# Patient Record
Sex: Male | Born: 1990
Health system: Southern US, Community
[De-identification: ages and names within clinical notes are randomized; demographics above are authoritative.]

## PROBLEM LIST (undated history)

## (undated) DIAGNOSIS — F329 Major depressive disorder, single episode, unspecified: Secondary | ICD-10-CM

## (undated) DIAGNOSIS — J45909 Unspecified asthma, uncomplicated: Secondary | ICD-10-CM

## (undated) DIAGNOSIS — F32A Depression, unspecified: Secondary | ICD-10-CM

## (undated) DIAGNOSIS — F419 Anxiety disorder, unspecified: Secondary | ICD-10-CM

## (undated) DIAGNOSIS — T7840XA Allergy, unspecified, initial encounter: Secondary | ICD-10-CM

## (undated) HISTORY — PX: FOOT SURGERY: SHX648

## (undated) HISTORY — DX: Allergy, unspecified, initial encounter: T78.40XA

## (undated) HISTORY — DX: Unspecified asthma, uncomplicated: J45.909

## (undated) HISTORY — DX: Anxiety disorder, unspecified: F41.9

## (undated) HISTORY — DX: Major depressive disorder, single episode, unspecified: F32.9

## (undated) HISTORY — DX: Depression, unspecified: F32.A

---

## 2011-10-05 DIAGNOSIS — S97109A Crushing injury of unspecified toe(s), initial encounter: Secondary | ICD-10-CM | POA: Insufficient documentation

## 2013-05-04 DIAGNOSIS — K219 Gastro-esophageal reflux disease without esophagitis: Secondary | ICD-10-CM | POA: Insufficient documentation

## 2014-06-11 ENCOUNTER — Ambulatory Visit (INDEPENDENT_AMBULATORY_CARE_PROVIDER_SITE_OTHER): Payer: BLUE CROSS/BLUE SHIELD

## 2014-06-11 ENCOUNTER — Ambulatory Visit (INDEPENDENT_AMBULATORY_CARE_PROVIDER_SITE_OTHER): Payer: BLUE CROSS/BLUE SHIELD | Admitting: Internal Medicine

## 2014-06-11 VITALS — BP 122/82 | HR 85 | Temp 98.0°F | Resp 16 | Ht 72.0 in | Wt 176.4 lb

## 2014-06-11 DIAGNOSIS — R1032 Left lower quadrant pain: Secondary | ICD-10-CM | POA: Diagnosis not present

## 2014-06-11 DIAGNOSIS — R1031 Right lower quadrant pain: Secondary | ICD-10-CM

## 2014-06-11 DIAGNOSIS — M25551 Pain in right hip: Secondary | ICD-10-CM

## 2014-06-11 DIAGNOSIS — M25552 Pain in left hip: Secondary | ICD-10-CM

## 2014-06-11 NOTE — Progress Notes (Signed)
   Subjective:    Patient ID: George Duran, male    DOB: 07/17/1990, 24 y.o.   MRN: [id number]  HPI I, Trenda MootsMichelle Farrington R.T. (R), am scribing for Dr. Robert Bellowhris Velva Molinari.  George Duran is a 24 y.o. Male presenting today with bilateral hip pain. 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 He states he iced it 4 - 6 times that day and it felt worse over night. He has a low level of pain, according to him it just uncomfortable. 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 He has no change in his pain level during normal daily activities. He says now that he has the same pain in his left hip 5 days ago.  He has full rom and full function both extremities. He is worried he may have a congenital hip problem like his foot problems.   Review of Systems     Objective:   Physical Exam  Constitutional: He is oriented to person, place, and time. He appears well-developed and well-nourished. No distress.  Eyes: EOM are normal.  Neck: Normal range of motion.  Pulmonary/Chest: Effort normal.  Abdominal: Hernia confirmed negative in the right inguinal area and confirmed negative in the left inguinal area.  Genitourinary: Testes normal.  Musculoskeletal:       Right hip: He exhibits tenderness. He exhibits normal range of motion, normal strength, no bony tenderness, no swelling, no crepitus, no deformity and no laceration.       Left hip: He exhibits tenderness. He exhibits normal range of motion, normal strength, no bony tenderness, no swelling, no crepitus, no deformity and no laceration.       Legs: Dull constant ache in groin.  Lymphadenopathy:       Right: No inguinal adenopathy present.       Left: No inguinal adenopathy present.  Neurological: He is alert and oriented to person, place, and time. He displays normal reflexes. No cranial nerve deficit. He exhibits normal muscle tone. Coordination normal.  Psychiatric: He has a normal mood and  affect. His behavior is normal. Judgment and thought content normal.     [facility] reading (PRIMARY) by  Dr Perrin MalteseGuest possible left posterior acetabulum avulsion, stat read please.       Assessment & Plan:  Groin strains RICE Adjust work

## 2014-06-11 NOTE — Patient Instructions (Signed)
Groin Strain °A groin strain (also called a groin pull) is an injury to the muscles or tendon on the upper inner part of the thigh. These muscles are called the adductor muscles or groin muscles. They are responsible for moving the leg across the body. A muscle strain occurs when a muscle is overstretched and some muscle fibers are torn. A groin strain can range from mild to severe depending on how many muscle fibers are affected and whether the muscle fibers are partially or completely torn.  °Groin strains usually occur during exercise or participation in sports. The injury often happens when a sudden, violent force is placed on a muscle, stretching the muscle too far. A strain is more likely to occur when your muscles are not warmed up or if you are not properly conditioned. Depending on the severity of the groin strain, recovery time may vary from a few weeks to several weeks. Severe injuries often require 4-6 weeks for recovery. In these cases, complete healing can take 4-5 months.  °CAUSES  °· Stretching the groin muscles too far or too suddenly, often during side-to-side motion with an abrupt change in direction. °· Putting repeated stress on the groin muscles over a long period of time. °· Performing vigorous activity without properly stretching the groin muscles beforehand. °SYMPTOMS  °· Pain and tenderness in the groin area. This begins as sharp pain and persists as a dull ache. °· Popping or snapping feeling when the injury occurs (for severe strains). °· Swelling or bruising. °· Muscle spasms. °· Weakness in the leg. °· Stiffness in the groin area with decreased ability to move the affected muscles. °DIAGNOSIS  °Your caregiver will perform a physical exam to diagnose a groin strain. You will be asked about your symptoms and how the injury occurred. X-rays are sometimes needed to rule out a broken bone or cartilage problems. Your caregiver may order a CT scan or MRI if a complete muscle tear is  suspected. °TREATMENT  °A groin strain will often heal on its own. Your caregiver may prescribe medicines to help manage pain and swelling (anti-inflammatory medicine). You may be told to use crutches for the first few days to minimize your pain. °HOME CARE INSTRUCTIONS  °· Rest. Do not use the strained muscle if it causes pain. °· Put ice on the injured area. °¨ Put ice in a plastic bag. °¨ Place a towel between your skin and the bag. °¨ Leave the ice on for 15-20 minutes, every 2-3 hours. Do this for the first 2 days after the injury.  °· Only take over-the-counter or prescription medicines as directed by your caregiver. °· Wrap the injured area with an elastic bandage as directed by your caregiver. °· Keep the injured leg raised (elevated). °· Walk, stretch, and perform range-of-motion exercises to improve blood flow to the injured area. Only perform these activities if you can do so without any pain. °To prevent muscle strains: °· Warm up before exercise. °· Develop proper conditioning and strength in the groin muscles. °SEEK IMMEDIATE MEDICAL CARE IF:  °· You have increased pain or swelling in the affected area.   °· Your symptoms are not improving or are getting worse. °MAKE SURE YOU:  °· Understand these instructions. °· Will watch your condition. °· Will get help right away if you are not doing well or get worse. °Document Released: 10/28/2003 Document Revised: 02/16/2012 Document Reviewed: 11/03/2011 °ExitCare® Patient Information ©2015 [employer]. This information is not intended to replace advice given to you   by your health care provider. Make sure you discuss any questions you have with your health care provider. ° °

## 2014-09-22 ENCOUNTER — Emergency Department (HOSPITAL_COMMUNITY)
Admission: EM | Admit: 2014-09-22 | Discharge: 2014-09-22 | Disposition: A | Discharge status: Home / Self Care | Payer: BLUE CROSS/BLUE SHIELD | Attending: Emergency Medicine | Admitting: Emergency Medicine

## 2014-09-22 ENCOUNTER — Encounter (HOSPITAL_COMMUNITY): Payer: Self-pay | Admitting: *Deleted

## 2014-09-22 DIAGNOSIS — L259 Unspecified contact dermatitis, unspecified cause: Secondary | ICD-10-CM

## 2014-09-22 DIAGNOSIS — J45909 Unspecified asthma, uncomplicated: Secondary | ICD-10-CM | POA: Insufficient documentation

## 2014-09-22 DIAGNOSIS — T7840XA Allergy, unspecified, initial encounter: Secondary | ICD-10-CM | POA: Insufficient documentation

## 2014-09-22 DIAGNOSIS — Z8659 Personal history of other mental and behavioral disorders: Secondary | ICD-10-CM | POA: Insufficient documentation

## 2014-09-22 DIAGNOSIS — Z79899 Other long term (current) drug therapy: Secondary | ICD-10-CM | POA: Insufficient documentation

## 2014-09-22 MED ORDER — DIPHENHYDRAMINE HCL 25 MG PO TABS: 25.0000 mg | ORAL_TABLET | Freq: Four times a day (QID) | ORAL | Status: DC

## 2014-09-22 MED ORDER — PREDNISONE 10 MG (21) PO TBPK: 10.0000 mg | ORAL_TABLET | Freq: Every day | ORAL | Status: DC

## 2014-09-22 MED ORDER — DEXAMETHASONE SODIUM PHOSPHATE 10 MG/ML IJ SOLN
10.0000 mg | Freq: Once | INTRAMUSCULAR | Status: AC
  Administered 2014-09-22: 10 mg via INTRAMUSCULAR
  Filled 2014-09-22: qty 1

## 2014-09-22 MED ORDER — DIPHENHYDRAMINE HCL 25 MG PO CAPS
25.0000 mg | ORAL_CAPSULE | Freq: Once | ORAL | Status: AC
  Administered 2014-09-22: 25 mg via ORAL
  Filled 2014-09-22: qty 1

## 2014-09-22 MED ORDER — FAMOTIDINE 20 MG PO TABS
20.0000 mg | ORAL_TABLET | Freq: Once | ORAL | Status: AC
  Administered 2014-09-22: 20 mg via ORAL
  Filled 2014-09-22: qty 1

## 2014-09-22 MED ORDER — FAMOTIDINE 20 MG PO TABS: 20.0000 mg | ORAL_TABLET | Freq: Two times a day (BID) | ORAL | Status: DC

## 2014-09-22 NOTE — ED Provider Notes (Signed)
CSN: [id number]     Arrival date & time 09/22/14  1551 History   First MD Initiated Contact with Patient 09/22/14 [phone]     Chief Complaint  Patient presents with  . Allergic Reaction     (Consider location/radiation/quality/duration/timing/severity/associated sxs/prior Treatment) The history is provided by the patient.   Pt p/w rash that began in the left antecubital space 5 days ago and spread to his arms and legs over the past two days.  Today he felt tightness in his throat.  Denies choking, difficulty swallowing or breathing, N/V, CP.  No itching or swelling in the face or mouth.  Pt has known allergy to poison ivy but no known exposure.  Was exposed to new towels that he used without washing them and also cut mahogany wood for the first time - both right about the time he developed the rash.    Past Medical History  Diagnosis Date  . Allergy   . Anxiety   . Asthma   . Depression    Past Surgical History  Procedure Laterality Date  . Foot surgery     History reviewed. No pertinent family history. History  Substance Use Topics  . Smoking status: Never Smoker   . Smokeless tobacco: Not on file  . Alcohol Use: No    Review of Systems  Constitutional: Negative for fever.  HENT: Negative for trouble swallowing.   Respiratory: Negative for cough, choking, shortness of breath, wheezing and stridor.   Gastrointestinal: Negative for nausea and vomiting.  Allergic/Immunologic: Negative for immunocompromised state.  Neurological: Negative for weakness and numbness.  Hematological: Does not bruise/bleed easily.      Allergies  Review of patient's allergies indicates no known allergies.  Home Medications   Prior to Admission medications   Medication Sig Start Date End Date Taking? Authorizing Provider  pantoprazole (PROTONIX) 20 MG tablet Take 20 mg by mouth daily.    Historical Provider, MD   BP 119/67 mmHg  Pulse 78  Temp(Src) 98.1 F (36.7 C) (Oral)  Resp 18  SpO2  94% Physical Exam  Constitutional: He appears well-developed and well-nourished. No distress.  HENT:  Head: Normocephalic and atraumatic.  Mouth/Throat: Oropharynx is clear and moist. No oropharyngeal exudate.  Eyes: Conjunctivae are normal.  Neck: Normal range of motion. Neck supple.  Cardiovascular: Normal rate and regular rhythm.   Pulmonary/Chest: Effort normal and breath sounds normal. No stridor. No respiratory distress. He has no wheezes. He has no rales.  Lymphadenopathy:    He has no cervical adenopathy.  Neurological: He is alert.  Skin: Rash noted. He is not diaphoretic.  Erythematous papular, clear vesicular rash over left arm and left hand intertriginous spaces, also over bilateral legs more diffusely.   The rash exists only distal to the lines of his t-shirt and shorts.  Not on palms or soles.   Nursing note and vitals reviewed.   ED Course  Procedures (including critical care time) Labs Review Labs Reviewed - No data to display  Imaging Review No results found.   EKG Interpretation None       7:00 PM Pt reports sensation of throat swelling has resolved.    MDM   Final diagnoses:  Contact dermatitis  Allergic reaction, initial encounter    Afebrile, nontoxic patient with likely contact dermatitis with allergic reaction.  No airway concerns.  Pt improved with IM decadron and PO benadryl and pepcid.   D/C home with prednisone, benadryl, pepcid.  Discussed return precations.  PCP follow  up.   Discussed result, findings, treatment, and follow up  with patient.  Pt given return precautions.  Pt verbalizes understanding and agrees with plan.        Trixie Dredgemily Hallelujah Wysong, PA-C 09/22/14 2229  Elwin MochaBlair Walden, MD 09/23/14 [phone]

## 2014-09-22 NOTE — Discharge Instructions (Signed)
Read the information below.  You may return to the Emergency Department at any time for worsening condition or any new symptoms that concern you. If you develop itching or swelling in your mouth or throat or any difficulty swallowing or breathing, call 911 or return to the Emergency Department immediately for a recheck.    ° °Contact Dermatitis °Contact dermatitis is a rash that happens when something touches the skin. You touched something that irritates your skin, or you have allergies to something you touched. °HOME CARE  °· Avoid the thing that caused your rash. °· Keep your rash away from hot water, soap, sunlight, chemicals, and other things that might bother it. °· Do not scratch your rash. °· You can take cool baths to help stop itching. °· Only take medicine as told by your doctor. °· Keep all doctor visits as told. °GET HELP RIGHT AWAY IF:  °· Your rash is not better after 3 days. °· Your rash gets worse. °· Your rash is puffy (swollen), tender, red, sore, or warm. °· You have problems with your medicine. °MAKE SURE YOU:  °· Understand these instructions. °· Will watch your condition. °· Will get help right away if you are not doing well or get worse. °Document Released: 12/27/2008 Document Revised: 05/24/2011 Document Reviewed: 08/04/2010 °ExitCare® Patient Information ©2015 [employer]. This information is not intended to replace advice given to you by your health care provider. Make sure you discuss any questions you have with your health care provider. ° °Allergies ° Allergies may happen from anything your body is sensitive to. This may be food, medicines, pollens, chemicals, and many other things. Food allergies can be severe and deadly.  °HOME CARE °· If you do not know what causes a reaction, keep a diary. Write down the foods you ate and the symptoms that followed. Avoid foods that cause reactions. °· If you have red raised spots (hives) or a rash: °¨ Take medicine as told by your doctor. °¨ Use  medicines for red raised spots and itching as needed. °¨ Apply cold cloths (compresses) to the skin. Take a cool bath. Avoid hot baths or showers. °· If you are severely allergic: °¨ It is often necessary to go to the hospital after you have treated your reaction. °¨ Wear your medical alert jewelry. °¨ You and your family must learn how to give a allergy shot or use an allergy kit (anaphylaxis kit). °¨ Always carry your allergy kit or shot with you. Use this medicine as told by your doctor if a severe reaction is occurring. °GET HELP RIGHT AWAY IF: °· You have trouble breathing or are making high-pitched whistling sounds (wheezing). °· You have a tight feeling in your chest or throat. °· You have a puffy (swollen) mouth. °· You have red raised spots, puffiness (swelling), or itching all over your body. °· You have had a severe reaction that was helped by your allergy kit or shot. The reaction can return once the medicine has worn off. °· You think you are having a food allergy. Symptoms most often happen within 30 minutes of eating a food. °· Your symptoms have not gone away within 2 days or are getting worse. °· You have new symptoms. °· You want to retest yourself with a food or drink you think causes an allergic reaction. Only do this under the care of a doctor. °MAKE SURE YOU:  °· Understand these instructions. °· Will watch your condition. °· Will get help right away   if you are not doing well or get worse. °Document Released: 06/26/2012 Document Reviewed: 06/26/2012 °ExitCare® Patient Information ©2015 [employer]. This information is not intended to replace advice given to you by your health care provider. Make sure you discuss any questions you have with your health care provider. ° °

## 2014-09-22 NOTE — ED Notes (Signed)
Pt reports having allergic reaction, unsure what his reaction is to. Reports hives all over and feels like throat is swelling. spo2 99% at triage.

## 2014-09-22 NOTE — ED Notes (Signed)
PA at the bedside.

## 2014-11-26 ENCOUNTER — Ambulatory Visit (INDEPENDENT_AMBULATORY_CARE_PROVIDER_SITE_OTHER): Payer: BLUE CROSS/BLUE SHIELD | Admitting: Family Medicine

## 2014-11-26 ENCOUNTER — Encounter: Payer: Self-pay | Admitting: Family Medicine

## 2014-11-26 VITALS — BP 105/69 | HR 88 | Temp 98.4°F | Resp 16 | Ht 70.0 in | Wt 179.4 lb

## 2014-11-26 DIAGNOSIS — R200 Anesthesia of skin: Secondary | ICD-10-CM

## 2014-11-26 DIAGNOSIS — Z23 Encounter for immunization: Secondary | ICD-10-CM

## 2014-11-26 DIAGNOSIS — Z0000 Encounter for general adult medical examination without abnormal findings: Secondary | ICD-10-CM | POA: Diagnosis not present

## 2014-11-26 DIAGNOSIS — J309 Allergic rhinitis, unspecified: Secondary | ICD-10-CM

## 2014-11-26 DIAGNOSIS — H538 Other visual disturbances: Secondary | ICD-10-CM

## 2014-11-26 DIAGNOSIS — R490 Dysphonia: Secondary | ICD-10-CM

## 2014-11-26 DIAGNOSIS — R631 Polydipsia: Secondary | ICD-10-CM | POA: Diagnosis not present

## 2014-11-26 DIAGNOSIS — R208 Other disturbances of skin sensation: Secondary | ICD-10-CM | POA: Diagnosis not present

## 2014-11-26 DIAGNOSIS — R479 Unspecified speech disturbances: Secondary | ICD-10-CM

## 2014-11-26 LAB — COMPREHENSIVE METABOLIC PANEL
ALBUMIN: 4.8 g/dL (ref 3.6–5.1)
ALK PHOS: 51 U/L (ref 40–115)
ALT: 14 U/L (ref 9–46)
AST: 16 U/L (ref 10–40)
BILIRUBIN TOTAL: 1.6 mg/dL — AB (ref 0.2–1.2)
BUN: 22 mg/dL (ref 7–25)
CALCIUM: 9.8 mg/dL (ref 8.6–10.3)
CO2: 24 mmol/L (ref 20–31)
Chloride: 102 mmol/L (ref 98–110)
Creat: 0.81 mg/dL (ref 0.60–1.35)
Glucose, Bld: 87 mg/dL (ref 65–99)
Potassium: 4.2 mmol/L (ref 3.5–5.3)
Sodium: 139 mmol/L (ref 135–146)
Total Protein: 7.3 g/dL (ref 6.1–8.1)

## 2014-11-26 LAB — TSH: TSH: 1.316 u[IU]/mL (ref 0.350–4.500)

## 2014-11-26 LAB — CBC
HEMATOCRIT: 44.1 % (ref 39.0–52.0)
HEMOGLOBIN: 15.3 g/dL (ref 13.0–17.0)
MCH: 31 pg (ref 26.0–34.0)
MCHC: 34.7 g/dL (ref 30.0–36.0)
MCV: 89.3 fL (ref 78.0–100.0)
MPV: 10 fL (ref 8.6–12.4)
Platelets: 275 10*3/uL (ref 150–400)
RBC: 4.94 MIL/uL (ref 4.22–5.81)
RDW: 13.4 % (ref 11.5–15.5)
WBC: 9.1 10*3/uL (ref 4.0–10.5)

## 2014-11-26 NOTE — Patient Instructions (Signed)
Please use flonase daily, you can add an oral anthistamine Can use afrin for up to 3 days then take a break.

## 2014-11-26 NOTE — Progress Notes (Signed)
Subjective:    Patient ID: George Duran, male    DOB: [birth year], 24 y.o.   MRN: [id number]  HPI This is a pleasant 24 yo male who presents today with 3 days of sinus symptoms and he would like a CPE.  He has had 10 sinus "infections" in the last 12 months. He can usually get resolution with decongestants, saline spray and honey. 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 He is married and has a 32 year old daughter. He denies any unusual stress or anxiety.   Last CPE- long time Tdap- today Flu- declines Dental- rarely Eye- never Exercise- with work- very physical job  Past Medical History  Diagnosis Date  . Allergy   . Anxiety   . Asthma   . Depression    Past Surgical History  Procedure Laterality Date  . Foot surgery Left     and right foot x 10 years ago   Family History  Problem Relation Age of Onset  . Hyperlipidemia Mother   . Hypertension Mother   . Heart disease Mother   . Hyperlipidemia Father   . Cancer Maternal Grandmother     lung and liver  . Hyperlipidemia Maternal Grandmother   . Cancer Maternal Grandfather     colon and prostate  . Hyperlipidemia Maternal Grandfather   . Heart disease Maternal Grandfather   . Stroke Maternal Grandfather   . Hyperlipidemia Paternal Grandmother    Social History  Substance Use Topics  . Smoking status: Never Smoker   . Smokeless tobacco: Not on file  . Alcohol Use: No     Comment: rare a beer  Medications, allergies, past medical history, surgical history, family history, social history and problem list reviewed and updated.  Review of Systems  Constitutional: Negative for fever and chills.  HENT: Positive for sinus pressure and sore throat.   Eyes: Positive for visual disturbance (he notices that his vision seems to "come and go, " having periods of vision seeming fuzzy. Not associated with headache. ).   Cardiovascular: Negative.   Gastrointestinal: Negative.   Endocrine: Positive for polydipsia and polyphagia. Negative for polyuria.  Genitourinary: Negative.   Musculoskeletal: Positive for back pain.       Has noticed his hands go to sleep if he is in the same position for more than 5 minutes.   Skin: Negative.   Allergic/Immunologic: Positive for environmental allergies.  Neurological: Positive for speech difficulty (has difficulty getting his words out and finding the right word. ) and headaches.  Hematological: Negative.   Psychiatric/Behavioral: Negative.       Objective:   Physical Exam Physical Exam  Constitutional: He is oriented to person, place, and time. He appears well-developed and well-nourished.  HENT:  Head: Normocephalic and atraumatic.  Eyes: PERRLA Right Ear: External ear normal.  Left Ear: External ear normal.  Nose: Nose normal.  Mouth/Throat: Oropharynx is clear and moist.  Eyes: Conjunctivae are normal. Pupils are equal, round, and reactive to light.  Neck: Normal range of motion. Neck supple.  Cardiovascular: Normal rate, regular rhythm, normal heart sounds and intact distal pulses.   Pulmonary/Chest: Effort normal and breath sounds normal.  Abdominal: Soft. Bowel sounds are normal.  Musculoskeletal: Normal range of motion. He exhibits no edema or tenderness. Strength 5/5 = throughout.      Cervical back: Normal.  Thoracic back: Normal.       Lumbar back: Normal.  Lymphadenopathy:    He has no cervical adenopathy.       Right: No inguinal adenopathy present.       Left: No inguinal adenopathy present.  Neurological: He is alert and oriented to person, place, and time. He has normal reflexes.  Skin: Skin is warm and dry.  Psychiatric: He has a normal mood and affect. His behavior is normal. Judgment normal.  Vitals reviewed.  BP 105/69 mmHg  Pulse 88  Temp(Src) 98.4 F (36.9 C) (Oral)  Resp 16  Ht  (1.778 m)  Wt 179 lb 6.4 oz (81.375  kg)  BMI 25.74 kg/m2 Wt Readings from Last 3 Encounters:  11/26/14 179 lb 6.4 oz (81.375 kg)  06/11/14 176 lb 6.4 oz (80.015 kg)   Depression screen PHQ 2/9 11/26/2014  Decreased Interest 0  Down, Depressed, Hopeless 0  PHQ - 2 Score 0      Assessment & Plan:  1. Annual physical exam - This is a young adult who presents today for CPE. He has multiple complaints and concerns.  2. Allergic rhinitis, unspecified allergic rhinitis type - patient prefers to treat his symptoms with "natural remedies." Discussed using saline nose spray regularly and ingesting local honey. Discussed OTC treatment options including antihistamines, decongestants, steroid nasal sprays.  3. Blurred vision - CBC - Comprehensive metabolic panel - Ambulatory referral to Neurology  4. Speaking difficulty - CBC - Comprehensive metabolic panel - TSH - Ambulatory referral to Neuroloy  5. Need for Tdap vaccination - Tdap today  6. Polydipsia - CBC - Comprehensive metabolic panel  7. Numbness in both hands - TSH   Olean Ree, FNP-BC  [facility], [facility]  11/29/2014 6:19 PM

## 2014-11-29 ENCOUNTER — Encounter: Payer: Self-pay | Admitting: Family Medicine

## 2014-12-31 ENCOUNTER — Telehealth: Payer: Self-pay | Admitting: Family Medicine

## 2014-12-31 NOTE — Telephone Encounter (Signed)
lmom of pt. New appt time on 02/18/15 at 3:30

## 2015-01-10 ENCOUNTER — Ambulatory Visit: Payer: BLUE CROSS/BLUE SHIELD | Admitting: Neurology

## 2015-02-18 ENCOUNTER — Ambulatory Visit: Payer: BLUE CROSS/BLUE SHIELD | Admitting: Family Medicine

## 2016-02-04 ENCOUNTER — Ambulatory Visit (INDEPENDENT_AMBULATORY_CARE_PROVIDER_SITE_OTHER): Payer: BLUE CROSS/BLUE SHIELD | Admitting: Physician Assistant

## 2016-02-04 ENCOUNTER — Ambulatory Visit (INDEPENDENT_AMBULATORY_CARE_PROVIDER_SITE_OTHER): Payer: BLUE CROSS/BLUE SHIELD

## 2016-02-04 VITALS — BP 112/82 | HR 65 | Temp 97.9°F | Resp 17 | Ht 70.25 in | Wt 188.0 lb

## 2016-02-04 DIAGNOSIS — R1030 Lower abdominal pain, unspecified: Secondary | ICD-10-CM | POA: Diagnosis not present

## 2016-02-04 DIAGNOSIS — M545 Low back pain, unspecified: Secondary | ICD-10-CM

## 2016-02-04 DIAGNOSIS — R109 Unspecified abdominal pain: Secondary | ICD-10-CM | POA: Diagnosis not present

## 2016-02-04 LAB — COMPLETE METABOLIC PANEL WITH GFR
ALBUMIN: 4.5 g/dL (ref 3.6–5.1)
ALK PHOS: 53 U/L (ref 40–115)
ALT: 12 U/L (ref 9–46)
AST: 15 U/L (ref 10–40)
BILIRUBIN TOTAL: 1.5 mg/dL — AB (ref 0.2–1.2)
BUN: 20 mg/dL (ref 7–25)
CALCIUM: 9.5 mg/dL (ref 8.6–10.3)
CO2: 27 mmol/L (ref 20–31)
Chloride: 103 mmol/L (ref 98–110)
Creat: 0.84 mg/dL (ref 0.60–1.35)
Glucose, Bld: 90 mg/dL (ref 65–99)
Potassium: 3.9 mmol/L (ref 3.5–5.3)
Sodium: 138 mmol/L (ref 135–146)
Total Protein: 7.2 g/dL (ref 6.1–8.1)

## 2016-02-04 LAB — POCT CBC
Granulocyte percent: 47.9 %G (ref 37–80)
HCT, POC: 41.1 % — AB (ref 43.5–53.7)
Hemoglobin: 14.8 g/dL (ref 14.1–18.1)
Lymph, poc: 3.9 — AB (ref 0.6–3.4)
MCH, POC: 31.9 pg — AB (ref 27–31.2)
MCHC: 35.9 g/dL — AB (ref 31.8–35.4)
MCV: 88.9 fL (ref 80–97)
MID (cbc): 0.6 (ref 0–0.9)
MPV: 7.5 fL (ref 0–99.8)
POC Granulocyte: 4.2 (ref 2–6.9)
POC LYMPH PERCENT: 44.8 %L (ref 10–50)
POC MID %: 7.3 % (ref 0–12)
Platelet Count, POC: 198 10*3/uL (ref 142–424)
RBC: 4.63 M/uL — AB (ref 4.69–6.13)
RDW, POC: 13 %
WBC: 8.7 10*3/uL (ref 4.6–10.2)

## 2016-02-04 LAB — POC MICROSCOPIC URINALYSIS (UMFC): MUCUS RE: ABSENT

## 2016-02-04 LAB — POCT URINALYSIS DIP (MANUAL ENTRY)
BILIRUBIN UA: NEGATIVE
GLUCOSE UA: NEGATIVE
Leukocytes, UA: NEGATIVE
Nitrite, UA: NEGATIVE
Protein Ur, POC: NEGATIVE
RBC UA: NEGATIVE
Spec Grav, UA: 1.02
UROBILINOGEN UA: 0.2
pH, UA: 6

## 2016-02-04 MED ORDER — CYCLOBENZAPRINE HCL 5 MG PO TABS: 5.0000 mg | ORAL_TABLET | Freq: Three times a day (TID) | ORAL | 0 refills | Status: DC | PRN

## 2016-02-04 NOTE — Patient Instructions (Addendum)
Start drinking lots of water and strain your urine to see if you past the stone. If symptoms worsen or you develop new onset fever, nausea, vomiting please seek care at the ER immediately.   Your CT is being scheduled and we will call you today with the appointment time and location on Friday.     Kidney Stones Kidney stones (urolithiasis) are rock-like masses that form inside of the kidneys. Kidneys are organs that make pee (urine). A kidney stone can cause very bad pain and can block the flow of pee. The stone usually leaves your body (passes) through your pee. You may need to have a doctor take out the stone. Follow these instructions at home: Eating and drinking  Drink enough fluid to keep your pee clear or pale yellow. This will help you pass the stone.  If told by your doctor, change the foods you eat (your diet). This may include:  Limiting how much salt (sodium) you eat.  Eating more fruits and vegetables.  Limiting how much meat, poultry, fish, and eggs you eat.  Follow instructions from your doctor about eating or drinking restrictions. General instructions  Collect pee samples as told by your doctor. You may need to collect a pee sample:  24 hours after a stone comes out.  8-12 weeks after a stone comes out, and every 6-12 months after that.  Strain your pee every time you pee (urinate), for as long as told. Use the strainer that your doctor recommends.  Do not throw out the stone. Keep it so that it can be tested by your doctor.  Take over-the-counter and prescription medicines only as told by your doctor.  Keep all follow-up visits as told by your doctor. This is important. You may need follow-up tests. Preventing kidney stones To prevent another kidney stone:  Drink enough fluid to keep your pee clear or pale yellow. This is the best way to prevent kidney stones.  Eat healthy foods.  Avoid certain foods as told by your doctor. You may be told to eat less  protein.  Stay at a healthy weight. Contact a doctor if:  You have pain that gets worse or does not get better with medicine. Get help right away if:  You have a fever or chills.  You get very bad pain.  You get new pain in your belly (abdomen).  You pass out (faint).  You cannot pee. This information is not intended to replace advice given to you by your health care provider. Make sure you discuss any questions you have with your health care provider. Document Released: 08/18/2007 Document Revised: 11/18/2015 Document Reviewed: 11/18/2015 Elsevier Interactive Patient Education  2017 [employer]    IF you received an x-ray today, you will receive an invoice from [facility]. Please contact [facility] at [phone] with questions or concerns regarding your invoice.   IF you received labwork today, you will receive an invoice from [facility]. Please contact [facility] at [phone] with questions or concerns regarding your invoice.   Our billing staff will not be able to assist you with questions regarding bills from these companies.  You will be contacted with the lab results as soon as they are available. The fastest way to get your results is to activate your My Chart account. Instructions are located on the last page of this paperwork. If you have not heard from us regarding the results in 2 weeks, please contact this office.

## 2016-02-04 NOTE — Progress Notes (Signed)
George SaCarl Dyal  MRN: [id number] DOB: 1990-04-28  Subjective:  George Duran is a 25 y.o. male seen in office today for a chief complaint of dull low back pain x 4 days.  He has associated stabbing intermittent right abdominal pain that lasts for about 30 minutes at a time. Notes it is pretty intense pain but not unbearable.  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 Denies acute injury, radiating shoulder pain, anorexia,  nausea, vomiting, hematuria, saddle anesthesia, urinary/bowel incontinence, and radiculopathy symptoms. Pt has no personal history of kidney stones. FH of kidney stones in mother and father.   Review of Systems  Constitutional: Negative for chills, diaphoresis and fatigue.  Respiratory: Negative for choking and shortness of breath.   Cardiovascular: Negative for chest pain and palpitations.  Gastrointestinal: Negative for blood in stool and constipation.  Genitourinary: Negative for difficulty urinating, dysuria, flank pain, testicular pain and urgency.  Neurological: Negative for dizziness and light-headedness.    Patient Active Problem List   Diagnosis Date Noted  . Crush injury of toe 10/05/2011    Current Outpatient Prescriptions on File Prior to Visit  Medication Sig Dispense Refill  . naproxen sodium (ANAPROX) 220 MG tablet Take 440 mg by mouth daily as needed (pain). ALEVE     No current facility-administered medications on file prior to visit.     No Known Allergies     Social History   Social History  . Marital status: Significant Other    Spouse name: N/A  . Number of children: N/A  . Years of education: N/A   Occupational History  . assembly technician    Social History Main Topics  . Smoking status: Never Smoker  . Smokeless tobacco: Not on file  . Alcohol use No     Comment: rare a beer  . Drug use: No  . Sexual activity: Not on file    Other Topics Concern  . Not on file   Social History Narrative  . No narrative on file    Objective:  BP 112/82 (BP Location: Right Arm, Patient Position: Sitting, Cuff Size: Normal)   Pulse 65   Temp 97.9 F (36.6 C) (Oral)   Resp 17   Ht 5' 10.25" (1.784 m)   Wt 188 lb (85.3 kg)   SpO2 98%   BMI 26.78 kg/m   Physical Exam  Constitutional: He is oriented to person, place, and time and well-developed, well-nourished, and in no distress. No distress.  HENT:  Head: Normocephalic and atraumatic.  Eyes: Conjunctivae are normal.  Neck: Normal range of motion.  Pulmonary/Chest: Effort normal.  Abdominal: Soft. Normal appearance and bowel sounds are normal. There is no tenderness. There is no rigidity, no guarding, no CVA tenderness, no tenderness at McBurney's point and negative Murphy's sign.  Musculoskeletal:       Thoracic back: Normal.       Lumbar back: He exhibits tenderness (with palpation of musculature bilaterally) and spasm (noted bilaterally). He exhibits normal range of motion, no bony tenderness and no swelling.  Neurological: He is alert and oriented to person, place, and time. He has normal reflexes. He has a normal Straight Leg Raise Test (bilaterally). Gait normal.  Skin: Skin is warm and dry.  Psychiatric: Affect normal.  Vitals reviewed.   Results for orders placed or performed in visit on 02/04/16 (from the past 24  hour(s))  POCT Microscopic Urinalysis ([facility])     Status: None   Collection Time: 02/04/16  4:11 PM  Result Value Ref Range   WBC,UR,HPF,POC None None WBC/hpf   RBC,UR,HPF,POC None None RBC/hpf   Bacteria None None, Too numerous to count   Mucus Absent Absent   Epithelial Cells, UR Per Microscopy None None, Too numerous to count cells/hpf  POCT urinalysis dipstick     Status: Abnormal   Collection Time: 02/04/16  4:11 PM  Result Value Ref Range   Color, UA yellow yellow   Clarity, UA clear clear   Glucose, UA negative negative    Bilirubin, UA negative negative   Ketones, POC UA trace (5) (A) negative   Spec Grav, UA 1.020    Blood, UA negative negative   pH, UA 6.0    Protein Ur, POC negative negative   Urobilinogen, UA 0.2    Nitrite, UA Negative Negative   Leukocytes, UA Negative Negative  POCT CBC     Status: Abnormal   Collection Time: 02/04/16  4:12 PM  Result Value Ref Range   WBC 8.7 4.6 - 10.2 K/uL   Lymph, poc 3.9 (A) 0.6 - 3.4   POC LYMPH PERCENT 44.8 10 - 50 %L   MID (cbc) 0.6 0 - 0.9   POC MID % 7.3 0 - 12 %M   POC Granulocyte 4.2 2 - 6.9   Granulocyte percent 47.9 37 - 80 %G   RBC 4.63 (A) 4.69 - 6.13 M/uL   Hemoglobin 14.8 14.1 - 18.1 g/dL   HCT, POC 16.1 (A) 09.6 - 53.7 %   MCV 88.9 80 - 97 fL   MCH, POC 31.9 (A) 27 - 31.2 pg   MCHC 35.9 (A) 31.8 - 35.4 g/dL   RDW, POC 04.5 %   Platelet Count, POC 198 142 - 424 K/uL   MPV 7.5 0 - 99.8 fL   Dg Abd 1 View  Result Date: 02/04/2016 CLINICAL DATA:  Sharp right-sided abdomen pain for 3 days EXAM: ABDOMEN - 1 VIEW COMPARISON:  None. FINDINGS: Supine views the abdomen show no bowel obstruction. Minimal prominence of small bowel loops is noted in the left abdomen. A is tiny right renal calculus cannot be excluded. Also there is a calcification low in the right bony pelvis and a distal right ureteral calculus cannot be excluded. No bony abnormality is seen. Consider CT the abdomen pelvis if further assessment is warranted. IMPRESSION: 1.  Slight prominence of small bowel loops on left abdomen. 2. Cannot exclude a small right renal calculus and possibly a distal right ureteral calculus by plain film. Consider CT of the abdomen pelvis to assess further if warranted. Electronically Signed   By: Dwyane Dee M.D.   On: 02/04/2016 16:47    Assessment and Plan :  This case was precepted with Dr. Cleta Alberts.   1. Bilateral low back pain without sciatica, unspecified chronicity - Likely musculoskeletal injury from job, instructed to use ice on affected area  daily  - cyclobenzaprine (FLEXERIL) 5 MG tablet; Take 1 tablet (5 mg total) by mouth 3 (three) times daily as needed for muscle spasms.  Dispense: 60 tablet; Refill: 0 -Return to clinic if symptoms worsen, do not improve in one week, or as needed  2. Intermittent lower abdominal pain -Plain film in office could not exclude renal calculus. Pt instructed to have CT today. He states he cannot have CT today because he has a Thanksgiving dinner to go to. He  said the earliest he can do is Friday. I have instructed him to drink lots of water in the meantime and have given him a strainer to use when he urinates. Instructed that if his pain worsens or begins to be constant or he develops nausea, vomiting, or anorexia he should go to the ER immediately. Pt understands and agrees to treatment.  - POCT CBC - POCT Microscopic Urinalysis ([facility]) - POCT urinalysis dipstick - COMPLETE METABOLIC PANEL WITH GFR - DG Abd 1 View; Future - CT Abdomen Pelvis Wo Contrast; Future  Benjiman CoreBrittany Johnothan Bascomb PA-C  [facility] 02/04/2016 4:54 PM

## 2016-02-06 ENCOUNTER — Telehealth: Payer: Self-pay

## 2016-02-06 NOTE — Telephone Encounter (Signed)
I left a VM with the patient this morning stating that scheduling is closed for the holiday, that if he is still in pain the best course of action is to walk into [facility] emergency [facility] and let them know there is an order for a Ct. He may have to wait, but they can get the scan preformed today. Should he chose to wait until Monday we would be happy to call and get the scan scheduled for him. I strongly encouraged that he go today and asked for a call back if he chooses to wait.

## 2016-02-24 IMAGING — CR DG HIP (WITH OR WITHOUT PELVIS) 2-3V*R*
3 series · 3 of 3 positions shown · non-contrast
Comparison: None.

CLINICAL DATA: Hip pain of unknown origin, possible left posterior
acetabular avulsion

EXAM:
LEFT HIP (WITH PELVIS) 2-3 VIEWS; RIGHT HIP (WITH PELVIS) 2-3 VIEWS

[AP (1 of 2)]
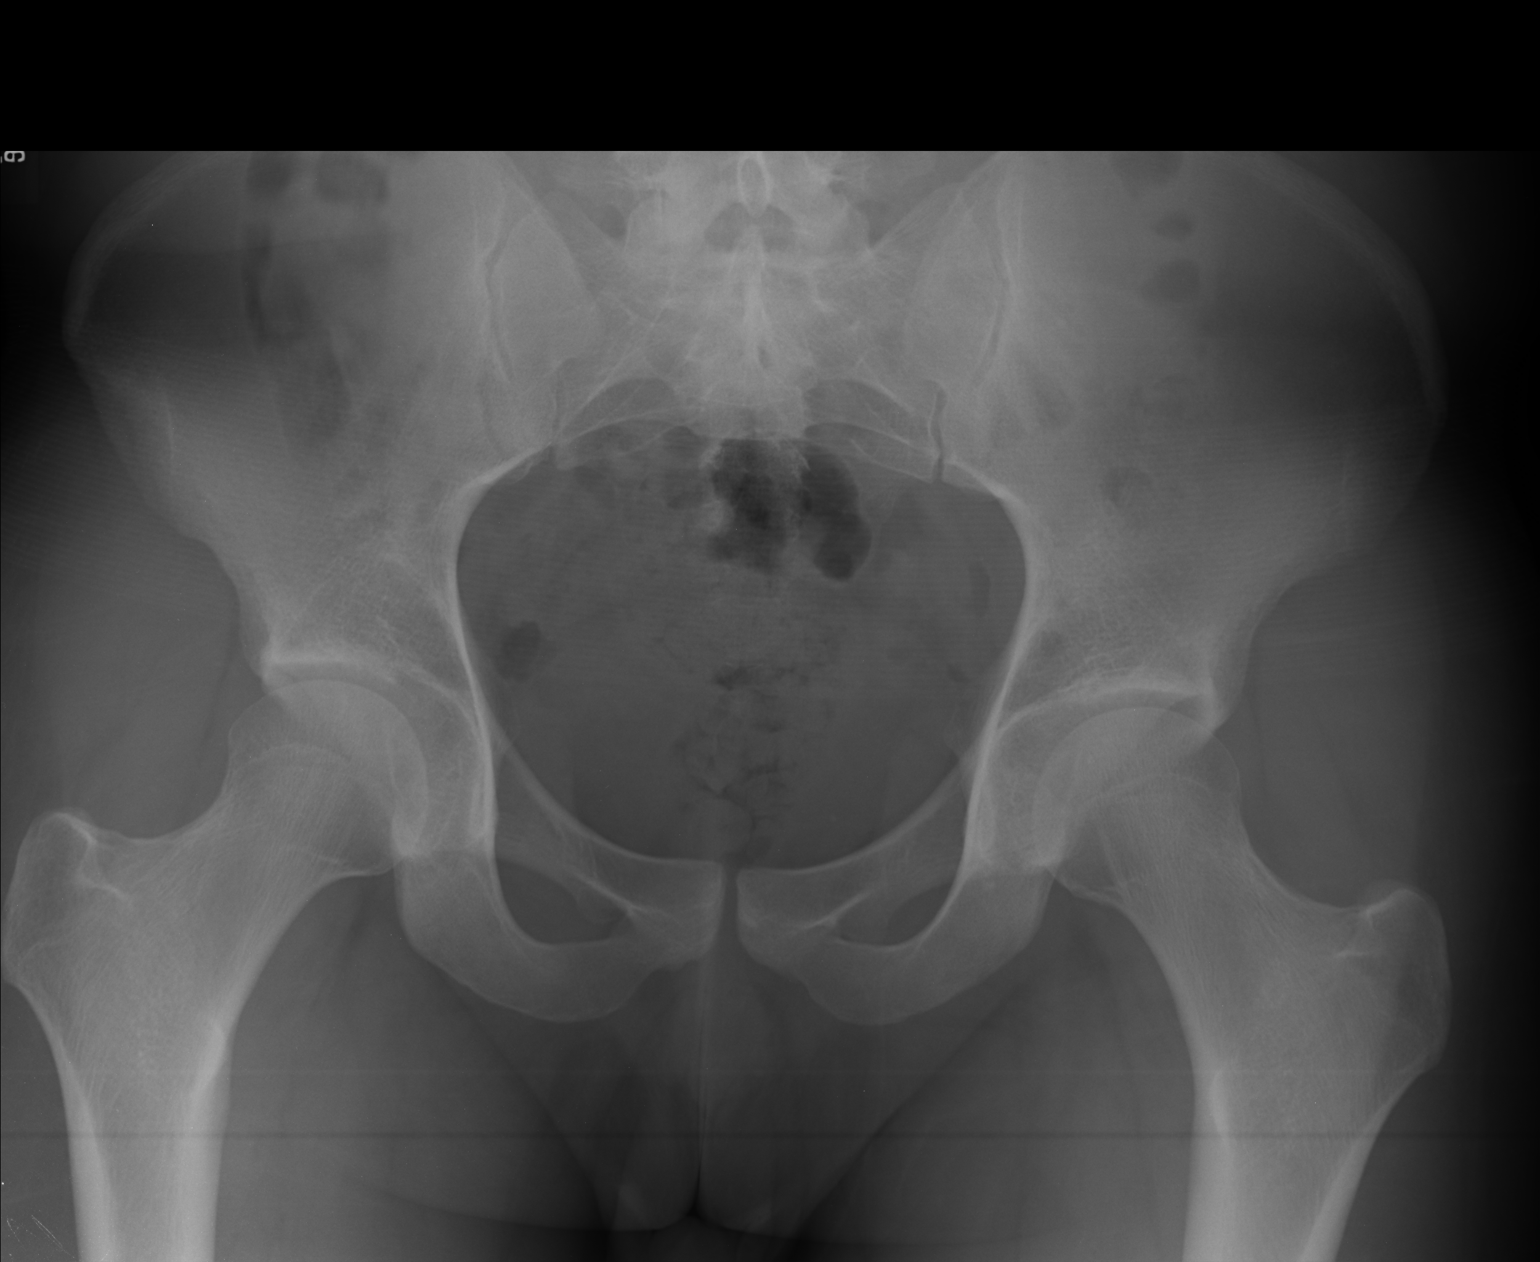

[AP (2 of 2)]
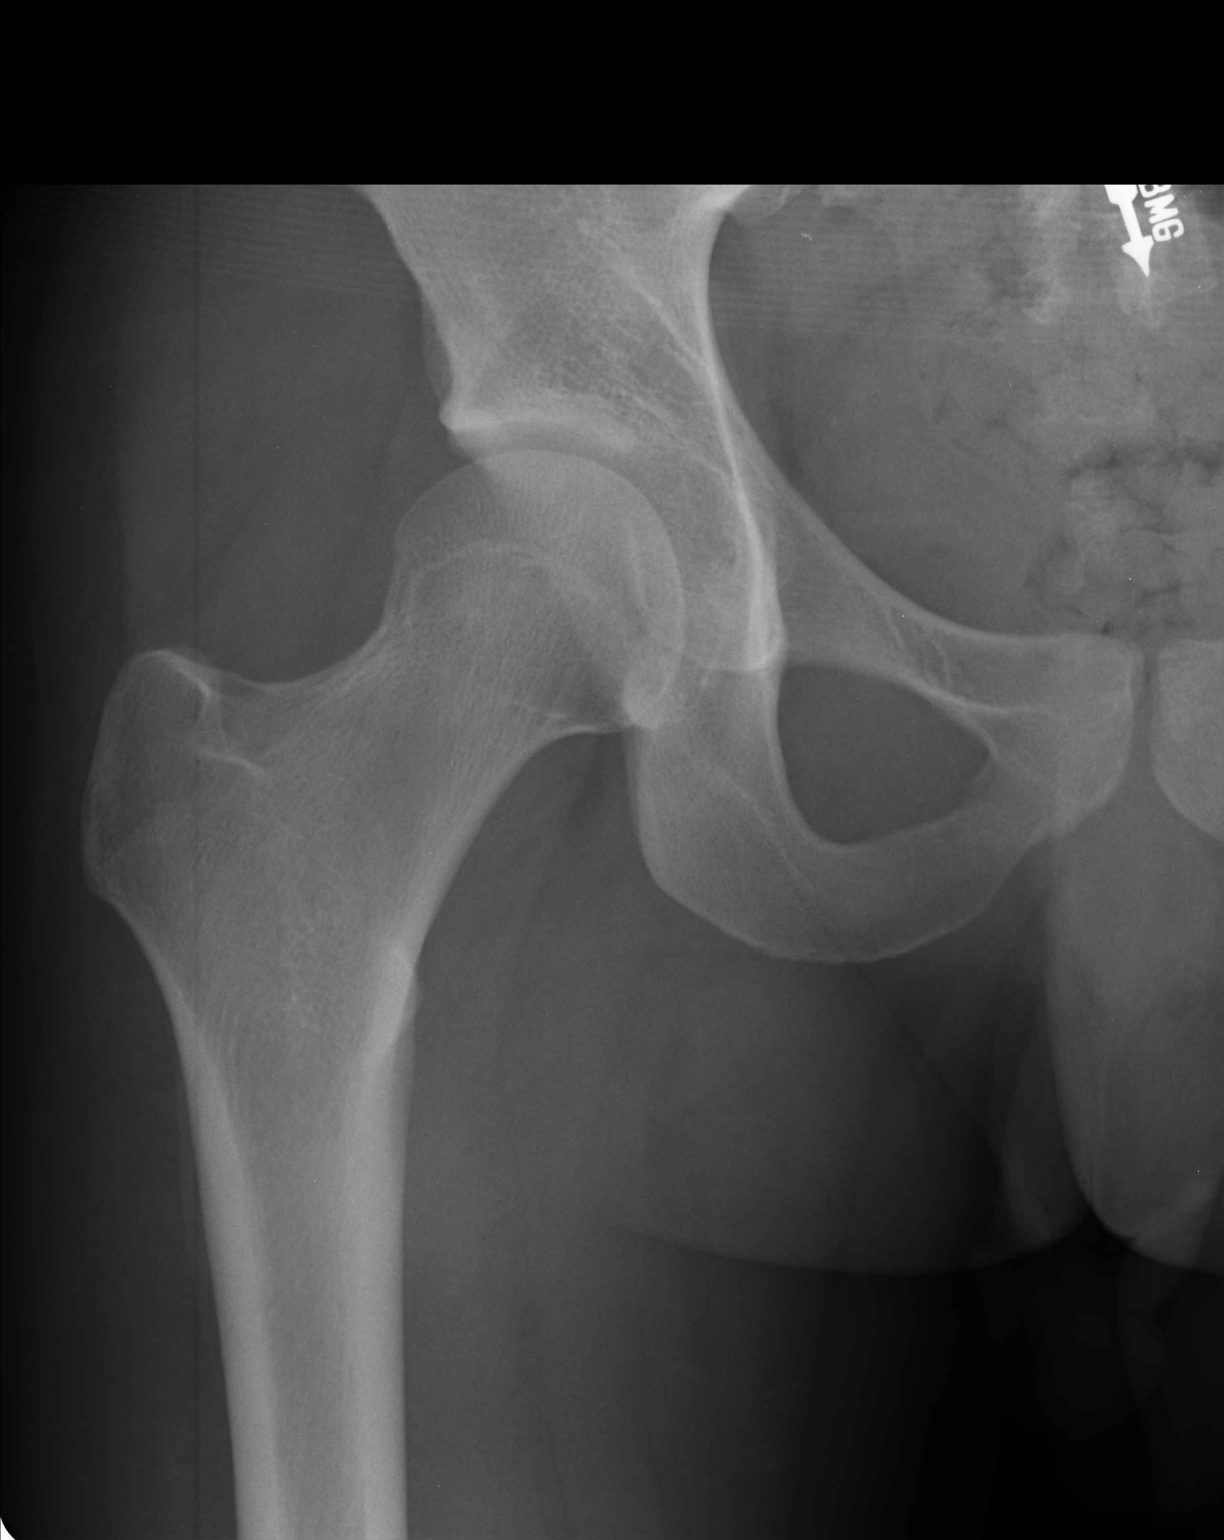

[lateral]
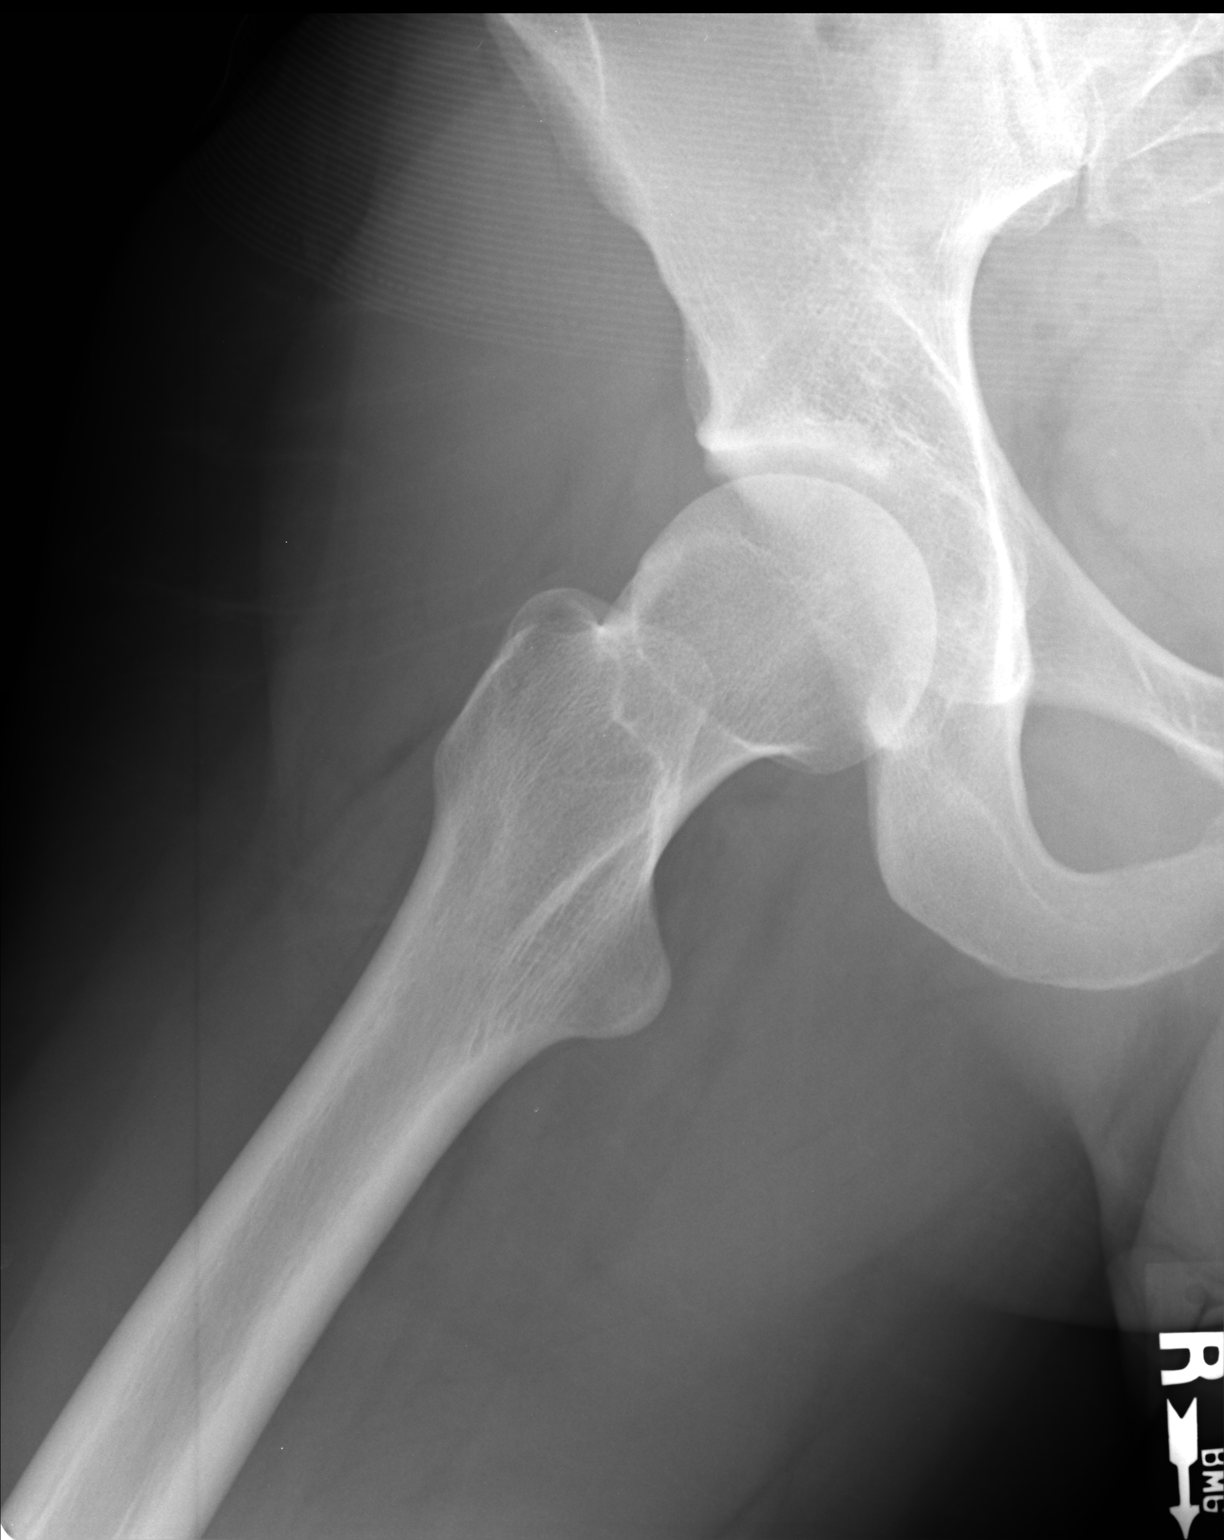

[3 of 3 positions shown; findings below may reference images not displayed]

FINDINGS: The bony pelvis is adequately mineralized. The hip joint spaces are
preserved. The acetabular cups appear normal with no bony avulsion.
The femoral heads, necks, and intertrochanteric regions are
unremarkable.
IMPRESSION: There is no acute bony abnormality of either hip. Specific attention
to the acetabuli reveals no acute abnormality. If there remains
strong clinical concern of such injury, pelvic CT scan would be
useful.

## 2016-04-16 ENCOUNTER — Telehealth: Payer: Self-pay | Admitting: Physician Assistant

## 2016-04-16 NOTE — Telephone Encounter (Signed)
Patient called the emergency line for office.  I have attempted to contact him.  I have dialed several times. States that last night he was sitting watching TV when he had sensation that room was spinning.  He was feeling warm sensation at the left side of face.  He was standing at work in retail, and had the same sensation, followed by warmed sensation.  He is No chest pains, palpitations, sob.  No UR symptoms.  No syncope.  No hx.  He does not hydrate very well.  No black or bloody stools.  No dietary restrictions.  Discussed that if it occurs again, or symptoms reoccur, he may go to the ed or open urgent care.  Otherwise come in tomorrow.

## 2016-04-20 ENCOUNTER — Ambulatory Visit (INDEPENDENT_AMBULATORY_CARE_PROVIDER_SITE_OTHER): Payer: BLUE CROSS/BLUE SHIELD | Admitting: Emergency Medicine

## 2016-04-20 VITALS — BP 126/78 | HR 78 | Temp 97.8°F | Resp 18 | Ht 70.25 in | Wt 195.0 lb

## 2016-04-20 DIAGNOSIS — R42 Dizziness and giddiness: Secondary | ICD-10-CM | POA: Diagnosis not present

## 2016-04-20 DIAGNOSIS — R479 Unspecified speech disturbances: Secondary | ICD-10-CM

## 2016-04-20 DIAGNOSIS — R110 Nausea: Secondary | ICD-10-CM | POA: Diagnosis not present

## 2016-04-20 NOTE — Patient Instructions (Signed)
Dizziness Dizziness is a common problem. It makes you feel unsteady or lightheaded. You may feel like you are about to pass out (faint). Dizziness can lead to injury if you stumble or fall. Anyone can get dizzy, but dizziness is more common in older adults. This condition can be caused by a number of things, including:  Medicines.  Dehydration.  Illness. Follow these instructions at home: Following these instructions may help with your condition: Eating and drinking  Drink enough fluid to keep your pee (urine) clear or pale yellow. This helps to keep you from getting dehydrated. Try to drink more clear fluids, such as water.  Do not drink alcohol.  Limit how much caffeine you drink or eat if told by your doctor.  Limit how much salt you drink or eat if told by your doctor. Activity  Avoid making quick movements.  When you stand up from sitting in a chair, steady yourself until you feel okay.  In the morning, first sit up on the side of the bed. When you feel okay, stand slowly while you hold onto something. Do this until you know that your balance is fine.  Move your legs often if you need to stand in one place for a long time. Tighten and relax your muscles in your legs while you are standing.  Do not drive or use heavy machinery if you feel dizzy.  Avoid bending down if you feel dizzy. Place items in your home so that they are easy for you to reach without leaning over. Lifestyle  Do not use any tobacco products, including cigarettes, chewing tobacco, or electronic cigarettes. If you need help quitting, ask your doctor.  Try to lower your stress level, such as with yoga or meditation. Talk with your doctor if you need help. General instructions  Watch your dizziness for any changes.  Take medicines only as told by your doctor. Talk with your doctor if you think that your dizziness is caused by a medicine that you are taking.  Tell a friend or a family member that you are  feeling dizzy. If he or she notices any changes in your behavior, have this person call your doctor.  Keep all follow-up visits as told by your doctor. This is important. Contact a doctor if:  Your dizziness does not go away.  Your dizziness or light-headedness gets worse.  You feel sick to your stomach (nauseous).  You have trouble hearing.  You have new symptoms.  You are unsteady on your feet or you feel like the room is spinning. Get help right away if:  You throw up (vomit) or have diarrhea and are unable to eat or drink anything.  You have trouble:  Talking.  Walking.  Swallowing.  Using your arms, hands, or legs.  You feel generally weak.  You are not thinking clearly or you have trouble forming sentences. It may take a friend or family member to notice this.  You have:  Chest pain.  Pain in your belly (abdomen).  Shortness of breath.  Sweating.  Your vision changes.  You are bleeding.  You have a headache.  You have neck pain or a stiff neck.  You have a fever. This information is not intended to replace advice given to you by your health care provider. Make sure you discuss any questions you have with your health care provider. Document Released: 02/18/2011 Document Revised: 08/07/2015 Document Reviewed: 02/25/2014 Elsevier Interactive Patient Education  2017 Elsevier Inc.  

## 2016-04-20 NOTE — Progress Notes (Signed)
George Duran 26 y.o.   Chief Complaint  Patient presents with  . DIZZY SPELLS    FEELS WARM ON FACE AFTER SPELL  . Nausea  . Pressure Behind the Eyes  . Aphasia    AT TIMES    HISTORY OF PRESENT ILLNESS: This is a 26 y.o. male complaining of dizzy spells x several days with occassional vertigo; +nausea; has had intermittent episodes of dysarthria for the past 2 years during which he's unable to speak well or people are not able to understand him; has +FHx of MS and other neurological conditions. At times he also feels pain/pressure behind the eyes; denies syncope but has been close to fainting at times.  HPI   Prior to Admission medications   Medication Sig Start Date End Date Taking? Authorizing Provider  naproxen sodium (ANAPROX) 220 MG tablet Take 440 mg by mouth daily as needed (pain). ALEVE   Yes Historical Provider, MD  cyclobenzaprine (FLEXERIL) 5 MG tablet Take 1 tablet (5 mg total) by mouth 3 (three) times daily as needed for muscle spasms. Patient not taking: Reported on 04/20/2016 02/04/16   Magdalene River, PA-C    Allergies  Allergen Reactions  . Bee Venom Shortness Of Breath and Swelling    Patient Active Problem List   Diagnosis Date Noted  . GERD (gastroesophageal reflux disease) 05/04/2013  . Crush injury of toe 10/05/2011    Past Medical History:  Diagnosis Date  . Allergy   . Anxiety   . Asthma   . Depression     Past Surgical History:  Procedure Laterality Date  . FOOT SURGERY Left    and right foot x 10 years ago    Social History   Social History  . Marital status: Significant Other    Spouse name: N/A  . Number of children: N/A  . Years of education: N/A   Occupational History  . assembly technician    Social History Main Topics  . Smoking status: Never Smoker  . Smokeless tobacco: Never Used  . Alcohol use No     Comment: rare a beer  . Drug use: No  . Sexual activity: Not on file   Other Topics Concern  . Not on file    Social History Narrative  . No narrative on file    Family History  Problem Relation Age of Onset  . Hyperlipidemia Mother   . Hypertension Mother   . Heart disease Mother   . Hyperlipidemia Father   . Cancer Maternal Grandmother     lung and liver  . Hyperlipidemia Maternal Grandmother   . Cancer Maternal Grandfather     colon and prostate  . Hyperlipidemia Maternal Grandfather   . Heart disease Maternal Grandfather   . Stroke Maternal Grandfather   . Hyperlipidemia Paternal Grandmother      Review of Systems  Constitutional: Negative for chills, diaphoresis, fever and malaise/fatigue.  HENT: Negative for congestion, ear discharge, ear pain, hearing loss, nosebleeds, sinus pain, sore throat and tinnitus.   Eyes: Negative for blurred vision, double vision, photophobia, pain, discharge and redness.       Has had trouble focusing.  Respiratory: Negative for cough, hemoptysis, shortness of breath and wheezing.   Cardiovascular: Negative for chest pain, palpitations, claudication and leg swelling.  Gastrointestinal: Positive for nausea. Negative for abdominal pain, diarrhea, heartburn and vomiting.  Genitourinary: Negative for dysuria and hematuria.  Musculoskeletal: Negative for back pain, joint pain, myalgias and neck pain.  Skin: Negative for  rash.  Neurological: Positive for dizziness, speech change and headaches. Negative for sensory change, focal weakness, seizures, loss of consciousness and weakness.  Endo/Heme/Allergies: Does not bruise/bleed easily.  Psychiatric/Behavioral: Negative for depression and substance abuse.  All other systems reviewed and are negative.  Vitals:   04/20/16 1612  BP: 126/78  Pulse: 78  Resp: 18  Temp: 97.8 F (36.6 C)     Physical Exam  Constitutional: He is oriented to person, place, and time. He appears well-developed and well-nourished.  HENT:  Head: Normocephalic and atraumatic.  Right Ear: Hearing, tympanic membrane and ear  canal normal.  Left Ear: Hearing, tympanic membrane and ear canal normal.  Eyes: Conjunctivae and EOM are normal. Pupils are equal, round, and reactive to light.  No papilledema.  Neck: Normal range of motion. Neck supple. No JVD present. No thyromegaly present.  Cardiovascular: Normal rate, regular rhythm, normal heart sounds and intact distal pulses.   Pulmonary/Chest: Effort normal and breath sounds normal. He has no wheezes. He has no rales.  Abdominal: Soft. Bowel sounds are normal. There is no tenderness.  Musculoskeletal: Normal range of motion.  Lymphadenopathy:    He has no cervical adenopathy.  Neurological: He is alert and oriented to person, place, and time. He displays normal reflexes. No cranial nerve deficit or sensory deficit. He exhibits normal muscle tone. Coordination normal.  Skin: Skin is warm and dry. Capillary refill takes less than 2 seconds.  Psychiatric: He has a normal mood and affect. His behavior is normal.  Vitals reviewed.    ASSESSMENT & PLAN: George Duran was seen today for dizzy spells, nausea, pressure behind the eyes and aphasia.  Diagnoses and all orders for this visit:  Dizziness and giddiness -     CBC with Differential/Platelet -     Comprehensive metabolic panel -     TSH  Speech problem Comments: r/o MS Orders: -     CT Head Wo Contrast; Future -     Ambulatory referral to Neurology  Nausea without vomiting    Patient Instructions  Dizziness Dizziness is a common problem. It makes you feel unsteady or lightheaded. You may feel like you are about to pass out (faint). Dizziness can lead to injury if you stumble or fall. Anyone can get dizzy, but dizziness is more common in older adults. This condition can be caused by a number of things, including:  Medicines.  Dehydration.  Illness. Follow these instructions at home: Following these instructions may help with your condition: Eating and drinking  Drink enough fluid to keep your pee  (urine) clear or pale yellow. This helps to keep you from getting dehydrated. Try to drink more clear fluids, such as water.  Do not drink alcohol.  Limit how much caffeine you drink or eat if told by your doctor.  Limit how much salt you drink or eat if told by your doctor. Activity  Avoid making quick movements.  When you stand up from sitting in a chair, steady yourself until you feel okay.  In the morning, first sit up on the side of the bed. When you feel okay, stand slowly while you hold onto something. Do this until you know that your balance is fine.  Move your legs often if you need to stand in one place for a long time. Tighten and relax your muscles in your legs while you are standing.  Do not drive or use heavy machinery if you feel dizzy.  Avoid bending down if you feel  dizzy. Place items in your home so that they are easy for you to reach without leaning over. Lifestyle  Do not use any tobacco products, including cigarettes, chewing tobacco, or electronic cigarettes. If you need help quitting, ask your doctor.  Try to lower your stress level, such as with yoga or meditation. Talk with your doctor if you need help. General instructions  Watch your dizziness for any changes.  Take medicines only as told by your doctor. Talk with your doctor if you think that your dizziness is caused by a medicine that you are taking.  Tell a friend or a family member that you are feeling dizzy. If he or she notices any changes in your behavior, have this person call your doctor.  Keep all follow-up visits as told by your doctor. This is important. Contact a doctor if:  Your dizziness does not go away.  Your dizziness or light-headedness gets worse.  You feel sick to your stomach (nauseous).  You have trouble hearing.  You have new symptoms.  You are unsteady on your feet or you feel like the room is spinning. Get help right away if:  You throw up (vomit) or have diarrhea  and are unable to eat or drink anything.  You have trouble:  Talking.  Walking.  Swallowing.  Using your arms, hands, or legs.  You feel generally weak.  You are not thinking clearly or you have trouble forming sentences. It may take a friend or family member to notice this.  You have:  Chest pain.  Pain in your belly (abdomen).  Shortness of breath.  Sweating.  Your vision changes.  You are bleeding.  You have a headache.  You have neck pain or a stiff neck.  You have a fever. This information is not intended to replace advice given to you by your health care provider. Make sure you discuss any questions you have with your health care provider. Document Released: 02/18/2011 Document Revised: 08/07/2015 Document Reviewed: 02/25/2014 Elsevier Interactive Patient Education  2017 Elsevier Inc.      Edwina BarthMiguel Jahlon Baines, MD [facility]

## 2016-04-21 ENCOUNTER — Telehealth: Payer: Self-pay

## 2016-04-21 ENCOUNTER — Encounter: Payer: Self-pay | Admitting: Radiology

## 2016-04-21 LAB — CBC WITH DIFFERENTIAL/PLATELET
BASOS: 1 %
Basophils Absolute: 0.1 10*3/uL (ref 0.0–0.2)
EOS (ABSOLUTE): 0.1 10*3/uL (ref 0.0–0.4)
Eos: 2 %
Hematocrit: 45.5 % (ref 37.5–51.0)
Hemoglobin: 15.6 g/dL (ref 13.0–17.7)
Immature Grans (Abs): 0 10*3/uL (ref 0.0–0.1)
Immature Granulocytes: 0 %
LYMPHS ABS: 3 10*3/uL (ref 0.7–3.1)
Lymphs: 39 %
MCH: 31 pg (ref 26.6–33.0)
MCHC: 34.3 g/dL (ref 31.5–35.7)
MCV: 91 fL (ref 79–97)
MONOS ABS: 0.7 10*3/uL (ref 0.1–0.9)
Monocytes: 9 %
NEUTROS PCT: 49 %
Neutrophils Absolute: 3.7 10*3/uL (ref 1.4–7.0)
PLATELETS: 218 10*3/uL (ref 150–379)
RBC: 5.03 x10E6/uL (ref 4.14–5.80)
RDW: 13.7 % (ref 12.3–15.4)
WBC: 7.6 10*3/uL (ref 3.4–10.8)

## 2016-04-21 LAB — COMPREHENSIVE METABOLIC PANEL
A/G RATIO: 1.6 (ref 1.2–2.2)
ALT: 13 IU/L (ref 0–44)
AST: 15 IU/L (ref 0–40)
Albumin: 4.4 g/dL (ref 3.5–5.5)
Alkaline Phosphatase: 61 IU/L (ref 39–117)
BILIRUBIN TOTAL: 1.4 mg/dL — AB (ref 0.0–1.2)
BUN/Creatinine Ratio: 23 — ABNORMAL HIGH (ref 9–20)
BUN: 18 mg/dL (ref 6–20)
CHLORIDE: 101 mmol/L (ref 96–106)
CO2: 19 mmol/L (ref 18–29)
Calcium: 9.5 mg/dL (ref 8.7–10.2)
Creatinine, Ser: 0.78 mg/dL (ref 0.76–1.27)
GFR calc Af Amer: 146 mL/min/{1.73_m2} (ref >59)
GFR calc non Af Amer: 126 mL/min/{1.73_m2} (ref >59)
GLOBULIN, TOTAL: 2.7 g/dL (ref 1.5–4.5)
Glucose: 99 mg/dL (ref 65–99)
Potassium: 4.3 mmol/L (ref 3.5–5.2)
SODIUM: 140 mmol/L (ref 134–144)
Total Protein: 7.1 g/dL (ref 6.0–8.5)

## 2016-04-21 LAB — TSH: TSH: 1.39 u[IU]/mL (ref 0.450–4.500)

## 2016-04-21 NOTE — Telephone Encounter (Signed)
PATIENT WAS IN THE OFFICE ON Tuesday AND HE SAW DR. SAGARDIA FOR DIZZINESS. HE GOT HIS LAB RESULTS THROUGH MY-CHART TODAY BUT HE NEEDS TO HAVE THEM EXPLAINED TO HIM. BEST PHONE [phone] (CELL) PHARMACY CHOICE IS [facility] ON [address]. MBC

## 2016-04-22 ENCOUNTER — Telehealth: Payer: Self-pay

## 2016-04-22 NOTE — Telephone Encounter (Signed)
fyi

## 2016-04-22 NOTE — Telephone Encounter (Signed)
L/m with nl labs, letter sent

## 2016-04-22 NOTE — Telephone Encounter (Signed)
Pt calling about ct scan spoke to sydney in referrals and they are going to get it scheduled for him

## 2016-04-26 ENCOUNTER — Encounter: Payer: Self-pay | Admitting: Neurology

## 2016-04-26 ENCOUNTER — Telehealth: Payer: Self-pay | Admitting: Neurology

## 2016-04-26 ENCOUNTER — Ambulatory Visit (INDEPENDENT_AMBULATORY_CARE_PROVIDER_SITE_OTHER): Payer: BLUE CROSS/BLUE SHIELD | Admitting: Neurology

## 2016-04-26 ENCOUNTER — Other Ambulatory Visit: Payer: Self-pay | Admitting: *Deleted

## 2016-04-26 DIAGNOSIS — Q159 Congenital malformation of eye, unspecified: Secondary | ICD-10-CM

## 2016-04-26 DIAGNOSIS — R410 Disorientation, unspecified: Secondary | ICD-10-CM | POA: Diagnosis not present

## 2016-04-26 MED ORDER — LEVETIRACETAM 500 MG PO TABS: 500.0000 mg | ORAL_TABLET | Freq: Two times a day (BID) | ORAL | 11 refills | Status: AC

## 2016-04-26 NOTE — Progress Notes (Signed)
PATIENT: Elis Sauber DOB: December 24, 1990  Chief Complaint  Patient presents with  . Difficulty with speech    He is here with his wife, Kennyth Arnold.  Reports intermittent episodes of the following symptoms:  loss of focus, dizziness, slurred speech, confusion, word finding difficulty, decreased concentration, tunnel vision.  Denies losing consciousness.  Events have lasted between two minutes to two hours before he returns to his baseline.  He has been experiencing this daily for the last two weeks.   Marland Kitchen PCP    Georgina Quint, MD     HISTORICAL  Wrangler Penning is a 26 years old right-handed male, accompanied by his wife Kennyth Arnold, seen in refer by his primary care  [facility], for evaluation of recurrent confusion spells, initial evaluation was February twelfth 2018.  He reported recurrent spells of sudden onset slurred speech since 2016, it used to happen intermittently, but since April 15 2016, he had increased spells, he was sitting watching TV, he had sudden onset confusion, dizziness, as if he is going to falling out of the bed, this last for few minutes, with mild slurred speech, confusion," as if I got a good buzz from alcohol", ever since the event, he be had multiple similar episode in a day, no loss of consciousness, but he described episode of confusion, forgot where he supposed to do. He denies chest pain, heart palpitation.  He worked at [employer], require some machine operation,  Such as using air gun  Reviewed laboratory evaluation, normal TSH, CBC, CMP with exception of slight elevated bilirubin 1.4,  REVIEW OF SYSTEMS: Full 14 system review of systems performed and notable only for fatigue, blurred vision, double vision, spinning sensation, confusion, slurred speech, dizziness, decreased energy. ALLERGIES: Allergies  Allergen Reactions  . Bee Venom Shortness Of Breath and Swelling    HOME MEDICATIONS: Current Outpatient Prescriptions  Medication  Sig Dispense Refill  . naproxen sodium (ANAPROX) 220 MG tablet Take 440 mg by mouth daily as needed (pain). ALEVE     No current facility-administered medications for this visit.     PAST MEDICAL HISTORY: Past Medical History:  Diagnosis Date  . Allergy   . Anxiety   . Asthma   . Depression     PAST SURGICAL HISTORY: Past Surgical History:  Procedure Laterality Date  . FOOT SURGERY Bilateral    and right foot x 10 years ago    FAMILY HISTORY: Family History  Problem Relation Age of Onset  . Hyperlipidemia Mother   . Hypertension Mother   . Heart disease Mother   . Hyperlipidemia Father   . Cancer Maternal Grandmother     lung and liver  . Hyperlipidemia Maternal Grandmother   . Cancer Maternal Grandfather     colon and prostate  . Hyperlipidemia Maternal Grandfather   . Heart disease Maternal Grandfather   . Stroke Maternal Grandfather   . Hyperlipidemia Paternal Grandmother     SOCIAL HISTORY:  Social History   Social History  . Marital status: Married    Spouse name: N/A  . Number of children: 1  . Years of education: HS   Occupational History  . assembly technician     builds school buses   Social History Main Topics  . Smoking status: Never Smoker  . Smokeless tobacco: Never Used  . Alcohol use No     Comment: rare a beer  . Drug use: No  . Sexual activity: Not on file   Other  Topics Concern  . Not on file   Social History Narrative   Lives at home with his wife and daughter.   Right-handed.   3 cups caffeine daily.     PHYSICAL EXAM   Vitals:   04/26/16 1055  BP: 130/76  Pulse: 67  Weight: 198 lb (89.8 kg)  Height: 5\' 10"  (1.778 m)    Not recorded      Body mass index is 28.41 kg/m.  PHYSICAL EXAMNIATION:  Gen: NAD, conversant, well nourised, obese, well groomed                     Cardiovascular: Regular rate rhythm, no peripheral edema, warm, nontender. Eyes: Conjunctivae clear without exudates or hemorrhage Neck:  Supple, no carotid bruits. Pulmonary: Clear to auscultation bilaterally   NEUROLOGICAL EXAM:  MENTAL STATUS: Speech:    Speech is normal; fluent and spontaneous with normal comprehension.  Cognition:     Orientation to time, place and person     Normal recent and remote memory     Normal Attention span and concentration     Normal Language, naming, repeating,spontaneous speech     Fund of knowledge   CRANIAL NERVES: CN II: Visual fields are full to confrontation. Fundoscopic exam is normal with sharp discs and no vascular changes. Pupils are round equal and briskly reactive to light. CN III, IV, VI: extraocular movement are normal. No ptosis. CN V: Facial sensation is intact to pinprick in all 3 divisions bilaterally. Corneal responses are intact.  CN VII: Face is symmetric with normal eye closure and smile. CN VIII: Hearing is normal to rubbing fingers CN IX, X: Palate elevates symmetrically. Phonation is normal. CN XI: Head turning and shoulder shrug are intact CN XII: Tongue is midline with normal movements and no atrophy.  MOTOR: There is no pronator drift of out-stretched arms. Muscle bulk and tone are normal. Muscle strength is normal.  REFLEXES: Reflexes are 2+ and symmetric at the biceps, triceps, knees, and ankles. Plantar responses are flexor.  SENSORY: Intact to light touch, pinprick, positional sensation and vibratory sensation are intact in fingers and toes.  COORDINATION: Rapid alternating movements and fine finger movements are intact. There is no dysmetria on finger-to-nose and heel-knee-shin.    GAIT/STANCE: Posture is normal. Gait is steady with normal steps, base, arm swing, and turning. Heel and toe walking are normal. Tandem gait is normal.  Romberg is absent.   DIAGNOSTIC DATA (LABS, IMAGING, TESTING) - I reviewed patient records, labs, notes, testing and imaging myself where available.   ASSESSMENT AND PLAN  Marlou SaCarl Wollschlager is a 26 y.o. male   Sudden  onset transient confusion,  Stereotypical episodes multiple episode in a day  Most suggestive of partial seizure  Proceed with MRI of the brain with and without contrast  EEG  No driving until episode free in 6 months  Start Keppra 500 twice a day  Return to clinic in one month   Levert FeinsteinYijun Monetta Lick, M.D. Ph.D.  [facility] [address], [address] [place], [place] [address] Ph: [phone] Fax: [phone]  CC: Georgina QuintMiguel Jose Sagardia, MD

## 2016-04-26 NOTE — Telephone Encounter (Signed)
He is going to make sure his employer needs FMLA completed, while he is undergoing evaluation.  If so, he will get the appropriate paperwork to our office.

## 2016-04-26 NOTE — Telephone Encounter (Signed)
Pt called said HR called him and advised he needs to get FMLA for future times he maybe out of work, not be able to drive for 6 mths. He is wanting to know if Dr Terrace ArabiaYan would fill these out.  Please call

## 2016-04-27 ENCOUNTER — Emergency Department (HOSPITAL_COMMUNITY)
Admission: EM | Admit: 2016-04-27 | Discharge: 2016-04-27 | Disposition: A | Discharge status: Home / Self Care | Payer: BLUE CROSS/BLUE SHIELD | Attending: Emergency Medicine | Admitting: Emergency Medicine

## 2016-04-27 ENCOUNTER — Encounter (HOSPITAL_COMMUNITY): Payer: Self-pay | Admitting: Emergency Medicine

## 2016-04-27 ENCOUNTER — Emergency Department (HOSPITAL_COMMUNITY): Payer: BLUE CROSS/BLUE SHIELD

## 2016-04-27 DIAGNOSIS — R42 Dizziness and giddiness: Secondary | ICD-10-CM | POA: Diagnosis not present

## 2016-04-27 DIAGNOSIS — J45909 Unspecified asthma, uncomplicated: Secondary | ICD-10-CM | POA: Insufficient documentation

## 2016-04-27 DIAGNOSIS — Z01818 Encounter for other preprocedural examination: Secondary | ICD-10-CM | POA: Diagnosis not present

## 2016-04-27 DIAGNOSIS — Z1389 Encounter for screening for other disorder: Secondary | ICD-10-CM

## 2016-04-27 LAB — CBC WITH DIFFERENTIAL/PLATELET
Basophils Absolute: 0.1 10*3/uL (ref 0.0–0.1)
Basophils Relative: 1 %
EOS ABS: 0.1 10*3/uL (ref 0.0–0.7)
Eosinophils Relative: 1 %
HCT: 45.6 % (ref 39.0–52.0)
Hemoglobin: 15.5 g/dL (ref 13.0–17.0)
LYMPHS PCT: 36 %
Lymphs Abs: 3.1 10*3/uL (ref 0.7–4.0)
MCH: 30.8 pg (ref 26.0–34.0)
MCHC: 34 g/dL (ref 30.0–36.0)
MCV: 90.7 fL (ref 78.0–100.0)
MONO ABS: 0.7 10*3/uL (ref 0.1–1.0)
Monocytes Relative: 8 %
Neutro Abs: 4.6 10*3/uL (ref 1.7–7.7)
Neutrophils Relative %: 54 %
PLATELETS: 206 10*3/uL (ref 150–400)
RBC: 5.03 MIL/uL (ref 4.22–5.81)
RDW: 12.8 % (ref 11.5–15.5)
WBC: 8.4 10*3/uL (ref 4.0–10.5)

## 2016-04-27 LAB — COMPREHENSIVE METABOLIC PANEL
ALT: 17 U/L (ref 17–63)
ANION GAP: 8 (ref 5–15)
AST: 19 U/L (ref 15–41)
Albumin: 4.6 g/dL (ref 3.5–5.0)
Alkaline Phosphatase: 49 U/L (ref 38–126)
BUN: 10 mg/dL (ref 6–20)
CHLORIDE: 107 mmol/L (ref 101–111)
CO2: 26 mmol/L (ref 22–32)
Calcium: 9.6 mg/dL (ref 8.9–10.3)
Creatinine, Ser: 0.96 mg/dL (ref 0.61–1.24)
GFR calc Af Amer: >60 mL/min (ref >60)
Glucose, Bld: 103 mg/dL — ABNORMAL HIGH (ref 65–99)
POTASSIUM: 4 mmol/L (ref 3.5–5.1)
SODIUM: 141 mmol/L (ref 135–145)
Total Bilirubin: 2.5 mg/dL — ABNORMAL HIGH (ref 0.3–1.2)
Total Protein: 7.3 g/dL (ref 6.5–8.1)

## 2016-04-27 LAB — ETHANOL

## 2016-04-27 MED ORDER — LORAZEPAM 2 MG/ML IJ SOLN
1.0000 mg | INTRAMUSCULAR | Status: DC | PRN
  Administered 2016-04-27: 1 mg via INTRAVENOUS
  Filled 2016-04-27: qty 1

## 2016-04-27 NOTE — ED Provider Notes (Signed)
[facility]-EMERGENCY DEPT Provider Note   CSN: [id number] Arrival date & time: 04/27/16  1512     History   Chief Complaint Chief Complaint  Patient presents with  . Dizziness    HPI George Duran is a 26 y.o. male.  HPI  Patient presents with concern disequilibrium, visual changes. Onset seems to began about 2 weeks ago, and since onset symptoms of been progressive, persistent. He notes that both with no motion and with motion he feels unsteady. Symptoms are worse with attempts at ambulation, including difficulty with walking. At rest the patient states as though he feels he is moving sideways, and has had difficulty with visual focus, without complete visual loss. No pain. No recent changes in medication, activity, diet. 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 During this illness patient was seen 1 urgent care, one neurologist. Patient was diagnosed with seizures, yesterday, started antiepileptics yesterday. However, the patient notes that his episodes are essentially constant, without clear beginning, and, without postictal phase.      Past Medical History:  Diagnosis Date  . Allergy   . Anxiety   . Asthma   . Depression     Patient Active Problem List   Diagnosis Date Noted  . Confusion 04/26/2016  . GERD (gastroesophageal reflux disease) 05/04/2013  . Crush injury of toe 10/05/2011    Past Surgical History:  Procedure Laterality Date  . FOOT SURGERY Bilateral    and right foot x 10 years ago       Home Medications    Prior to Admission medications   Medication Sig Start Date End Date Taking? Authorizing Provider  levETIRAcetam (KEPPRA) 500 MG tablet Take 1 tablet (500 mg total) by mouth 2 (two) times daily. 04/26/16   Levert FeinsteinYijun Yan, MD  naproxen sodium (ANAPROX) 220 MG tablet Take 440 mg by mouth daily as needed (pain). ALEVE    Historical Provider, MD    Family History Family History  Problem Relation Age of Onset  . Hyperlipidemia Mother     . Hypertension Mother   . Heart disease Mother   . Hyperlipidemia Father   . Cancer Maternal Grandmother     lung and liver  . Hyperlipidemia Maternal Grandmother   . Cancer Maternal Grandfather     colon and prostate  . Hyperlipidemia Maternal Grandfather   . Heart disease Maternal Grandfather   . Stroke Maternal Grandfather   . Hyperlipidemia Paternal Grandmother     Social History Social History  Substance Use Topics  . Smoking status: Never Smoker  . Smokeless tobacco: Never Used  . Alcohol use No     Comment: rare a beer     Allergies   Bee venom   Review of Systems Review of Systems  Constitutional:       Per HPI, otherwise negative  HENT:       Per HPI, otherwise negative  Respiratory:       Per HPI, otherwise negative  Cardiovascular:       Per HPI, otherwise negative  Gastrointestinal: Negative for vomiting.  Endocrine:       Negative aside from HPI  Genitourinary:       Neg aside from HPI   Musculoskeletal:       Per HPI, otherwise negative  Skin: Negative.   Neurological: Positive for dizziness, speech difficulty and headaches. Negative for syncope.     Physical Exam Updated Vital Signs BP 120/87 (BP Location: Left Arm)   Pulse 83  Temp 98.1 F (36.7 C) (Oral)   Resp 16   Ht 6' (1.829 m)   Wt 198 lb (89.8 kg)   SpO2 100%   BMI 26.85 kg/m   Physical Exam  Constitutional: He is oriented to person, place, and time. He appears well-developed. No distress.  HENT:  Head: Normocephalic and atraumatic.  Eyes: Conjunctivae and EOM are normal.  Cardiovascular: Normal rate and regular rhythm.   Pulmonary/Chest: Effort normal. No stridor. No respiratory distress.  Abdominal: He exhibits no distension.  Musculoskeletal: He exhibits no edema.  Neurological: He is alert and oriented to person, place, and time. He displays no tremor. No cranial nerve deficit or sensory deficit. Gait abnormal. Coordination normal.  Skin: Skin is warm and dry.   Psychiatric: He has a normal mood and affect.  Nursing note and vitals reviewed.    ED Treatments / Results  Labs (all labs ordered are listed, but only abnormal results are displayed) Labs Reviewed  COMPREHENSIVE METABOLIC PANEL - Abnormal; Notable for the following:       Result Value   Glucose, Bld 103 (*)    Total Bilirubin 2.5 (*)    All other components within normal limits  CBC WITH DIFFERENTIAL/PLATELET  URINALYSIS, ROUTINE W REFLEX MICROSCOPIC  ETHANOL    EKG  EKG Interpretation  Date/Time:  Tuesday April 27 2016 15:20:28 EST Ventricular Rate:  82 PR Interval:  130 QRS Duration: 102 QT Interval:  358 QTC Calculation: 418 R Axis:   66 Text Interpretation:  Normal sinus rhythm with sinus arrhythmia Borderline ECG Confirmed by Gerhard Munch  MD [id number]) on 04/27/2016 6:32:30 PM       Radiology Dg Eye Foreign Body  Result Date: 04/27/2016 CLINICAL DATA:  Metal working/exposure; clearance prior to MRI EXAM: ORBITS FOR FOREIGN BODY - 2 VIEW COMPARISON:  None. FINDINGS: There is no evidence of metallic foreign body within the orbits. No significant bone abnormality identified. IMPRESSION: No evidence of metallic foreign body within the orbits. Electronically Signed   By: Gaylyn Rong M.D.   On: 04/27/2016 19:21   Mr Brain Wo Contrast  Result Date: 04/27/2016 CLINICAL DATA:  Disequilibrium EXAM: MRI HEAD WITHOUT CONTRAST TECHNIQUE: Multiplanar, multiecho pulse sequences of the brain and surrounding structures were obtained without intravenous contrast. COMPARISON:  None. FINDINGS: Brain: No focal diffusion restriction to indicate acute infarct. No intraparenchymal hemorrhage. The brain parenchymal signal is normal. No mass lesion or midline shift. No hydrocephalus or extra-axial fluid collection. The midline structures are normal. No age advanced or lobar predominant atrophy. Vascular: Major intracranial arterial and venous sinus flow voids are preserved. No  evidence of chronic microhemorrhage or amyloid angiopathy. Skull and upper cervical spine: The visualized skull base, calvarium, upper cervical spine and extracranial soft tissues are normal. Sinuses/Orbits: No fluid levels or advanced mucosal thickening. No mastoid effusion. Normal orbits. IMPRESSION: Normal MRI of the brain. Electronically Signed   By: Deatra Robinson M.D.   On: 04/27/2016 20:51    Procedures Procedures (including critical care time)  Medications Ordered in ED Medications  LORazepam (ATIVAN) injection 1 mg (1 mg Intravenous Given 04/27/16 1903)   Chart review notable for ongoing evaluation with neurology, including recent initiation of antiepileptic medication.   Initial Impression / Assessment and Plan / ED Course  I have reviewed the triage vital signs and the nursing notes.  Pertinent labs & imaging results that were available during my care of the patient were reviewed by me and considered in my medical decision making (  see chart for details).   9:36 PM Patient in no distress, awake, alert. We discussed the reassuring MRI results, need to continue following with neurology for additional evaluation. With otherwise reassuring findings, no evidence for stroke, demyelination, and his generally reassuring physical exam, patient will follow-up as an outpatient.   Final Clinical Impressions(s) / ED Diagnoses   Final diagnoses:  Dizziness     Gerhard Munch, MD 04/27/16 2137

## 2016-04-27 NOTE — Discharge Instructions (Signed)
As discussed, your evaluation today has been largely reassuring.  But, it is important that you monitor your condition carefully, and do not hesitate to return to the ED if you develop new, or concerning changes in your condition. ? ?Otherwise, please follow-up with your physician for appropriate ongoing care. ? ?

## 2016-04-27 NOTE — ED Triage Notes (Signed)
Pt st's he has been having dizzy spells and periods of confusion for 2 weeks.  Pt c/o slight headache, st's his head feels heavy.  Pt st's he is scheduled for a EEG next week.  At this time pt is alert and oriented x's 3  Pt st's he feels weak all over.

## 2016-04-27 NOTE — Telephone Encounter (Signed)
I spoke with the patient to inform him I [facility]. He understood. He was also asking if we have received the fax from his employer about his FMLA forms.

## 2016-04-27 NOTE — ED Notes (Signed)
Patient transported to X-ray 

## 2016-04-27 NOTE — ED Notes (Signed)
Patient transported to MRI 

## 2016-04-27 NOTE — ED Notes (Signed)
Patient verbalized understanding of discharge instructions and denies any further needs or questions at this time. VS stable. Patient ambulatory with steady gait.  

## 2016-04-27 NOTE — ED Notes (Signed)
Pt returned from MRI °

## 2016-04-28 ENCOUNTER — Ambulatory Visit (HOSPITAL_COMMUNITY): Payer: BLUE CROSS/BLUE SHIELD

## 2016-04-29 ENCOUNTER — Ambulatory Visit (INDEPENDENT_AMBULATORY_CARE_PROVIDER_SITE_OTHER): Payer: BLUE CROSS/BLUE SHIELD | Admitting: Emergency Medicine

## 2016-04-29 VITALS — BP 116/88 | HR 73 | Temp 98.2°F | Resp 18 | Ht 72.0 in | Wt 194.8 lb

## 2016-04-29 DIAGNOSIS — R42 Dizziness and giddiness: Secondary | ICD-10-CM | POA: Diagnosis not present

## 2016-04-29 NOTE — Progress Notes (Signed)
George Duran 26 y.o.   Chief Complaint  Patient presents with  . Follow-up    Hospital follow up for returning back to work with restrictions     HISTORY OF PRESENT ILLNESS: This is a 26 y.o. male seen by me 04/20/16 for dizziness and referred to Neuro. Seen by Neuro, scheduled for EEG, started on anti-seizure meds; seen in ED 2 days ago and had MRI brain done; normal. Still having intermittent episodes of vertigo but better. Wants to return to work next Monday. No new symptoms. MRI report and Neurology consult reviewed.  HPI   Prior to Admission medications   Medication Sig Start Date End Date Taking? Authorizing Provider  levETIRAcetam (KEPPRA) 500 MG tablet Take 1 tablet (500 mg total) by mouth 2 (two) times daily. 04/26/16  Yes Levert Feinstein, MD  naproxen sodium (ANAPROX) 220 MG tablet Take 440 mg by mouth daily as needed (pain). ALEVE   Yes Historical Provider, MD    Allergies  Allergen Reactions  . Bee Venom Shortness Of Breath and Swelling    Patient Active Problem List   Diagnosis Date Noted  . Confusion 04/26/2016  . GERD (gastroesophageal reflux disease) 05/04/2013  . Crush injury of toe 10/05/2011    Past Medical History:  Diagnosis Date  . Allergy   . Anxiety   . Asthma   . Depression     Past Surgical History:  Procedure Laterality Date  . FOOT SURGERY Bilateral    and right foot x 10 years ago    Social History   Social History  . Marital status: Married    Spouse name: N/A  . Number of children: 1  . Years of education: HS   Occupational History  . assembly technician     builds school buses   Social History Main Topics  . Smoking status: Never Smoker  . Smokeless tobacco: Never Used  . Alcohol use No     Comment: rare a beer  . Drug use: No  . Sexual activity: Not on file   Other Topics Concern  . Not on file   Social History Narrative   Lives at home with his wife and daughter.   Right-handed.   3 cups caffeine daily.    Family  History  Problem Relation Age of Onset  . Hyperlipidemia Mother   . Hypertension Mother   . Heart disease Mother   . Hyperlipidemia Father   . Cancer Maternal Grandmother     lung and liver  . Hyperlipidemia Maternal Grandmother   . Cancer Maternal Grandfather     colon and prostate  . Hyperlipidemia Maternal Grandfather   . Heart disease Maternal Grandfather   . Stroke Maternal Grandfather   . Hyperlipidemia Paternal Grandmother      Review of Systems  Constitutional: Negative for chills, fever and malaise/fatigue.  HENT: Negative for nosebleeds and sore throat.   Eyes: Negative for blurred vision, double vision, discharge and redness.  Respiratory: Negative for cough and shortness of breath.   Cardiovascular: Negative for chest pain, palpitations and leg swelling.  Gastrointestinal: Negative for abdominal pain, nausea and vomiting.  Genitourinary: Negative for dysuria and hematuria.  Skin: Negative for rash.  Neurological: Positive for dizziness. Negative for sensory change, focal weakness, weakness and headaches.  Endo/Heme/Allergies: Does not bruise/bleed easily.  All other systems reviewed and are negative.  Vitals:   04/29/16 0816  BP: 116/88  Pulse: 73  Resp: 18  Temp: 98.2 F (36.8 C)  Physical Exam  Constitutional: He is oriented to person, place, and time. He appears well-developed and well-nourished.  HENT:  Head: Normocephalic and atraumatic.  Right Ear: Tympanic membrane and ear canal normal.  Left Ear: Tympanic membrane and ear canal normal.  Eyes: Conjunctivae and EOM are normal. Pupils are equal, round, and reactive to light.  Neck: Normal range of motion. Neck supple. No JVD present.  Cardiovascular: Normal rate, regular rhythm and normal heart sounds.   Pulmonary/Chest: Effort normal and breath sounds normal.  Musculoskeletal: Normal range of motion.  Lymphadenopathy:    He has no cervical adenopathy.  Neurological: He is alert and oriented  to person, place, and time. No sensory deficit. He exhibits normal muscle tone.  Skin: Skin is warm and dry. Capillary refill takes less than 2 seconds.  Psychiatric: He has a normal mood and affect. His behavior is normal.  Vitals reviewed.    ASSESSMENT & PLAN: Able to return to work next Monday.  Baldo AshCarl was seen today for follow-up.  Diagnoses and all orders for this visit:  Dizziness and giddiness  Vertigo Comments: r/o vestibular syndrome     Patient Instructions       IF you received an x-ray today, you will receive an invoice from [facility]. Please contact [facility] at [phone] with questions or concerns regarding your invoice.   IF you received labwork today, you will receive an invoice from [facility]. Please contact [facility] at [phone] with questions or concerns regarding your invoice.   Our billing staff will not be able to assist you with questions regarding bills from these companies.  You will be contacted with the lab results as soon as they are available. The fastest way to get your results is to activate your My Chart account. Instructions are located on the last page of this paperwork. If you have not heard from us regarding the results in 2 weeks, please contact this office.      Dizziness Dizziness is a common problem. It makes you feel unsteady or lightheaded. You may feel like you are about to pass out (faint). Dizziness can lead to injury if you stumble or fall. Anyone can get dizzy, but dizziness is more common in older adults. This condition can be caused by a number of things, including:  Medicines.  Dehydration.  Illness. Follow these instructions at home: Following these instructions may help with your condition: Eating and drinking  Drink enough fluid to keep your pee (urine) clear or pale yellow. This helps to keep you from getting dehydrated. Try to drink more clear fluids, such as water.  Do not drink  alcohol.  Limit how much caffeine you drink or eat if told by your doctor.  Limit how much salt you drink or eat if told by your doctor. Activity  Avoid making quick movements.  When you stand up from sitting in a chair, steady yourself until you feel okay.  In the morning, first sit up on the side of the bed. When you feel okay, stand slowly while you hold onto something. Do this until you know that your balance is fine.  Move your legs often if you need to stand in one place for a long time. Tighten and relax your muscles in your legs while you are standing.  Do not drive or use heavy machinery if you feel dizzy.  Avoid bending down if you feel dizzy. Place items in your home so that they are easy for you to reach without  leaning over. Lifestyle  Do not use any tobacco products, including cigarettes, chewing tobacco, or electronic cigarettes. If you need help quitting, ask your doctor.  Try to lower your stress level, such as with yoga or meditation. Talk with your doctor if you need help. General instructions  Watch your dizziness for any changes.  Take medicines only as told by your doctor. Talk with your doctor if you think that your dizziness is caused by a medicine that you are taking.  Tell a friend or a family member that you are feeling dizzy. If he or she notices any changes in your behavior, have this person call your doctor.  Keep all follow-up visits as told by your doctor. This is important. Contact a doctor if:  Your dizziness does not go away.  Your dizziness or light-headedness gets worse.  You feel sick to your stomach (nauseous).  You have trouble hearing.  You have new symptoms.  You are unsteady on your feet or you feel like the room is spinning. Get help right away if:  You throw up (vomit) or have diarrhea and are unable to eat or drink anything.  You have trouble:  Talking.  Walking.  Swallowing.  Using your arms, hands, or  legs.  You feel generally weak.  You are not thinking clearly or you have trouble forming sentences. It may take a friend or family member to notice this.  You have:  Chest pain.  Pain in your belly (abdomen).  Shortness of breath.  Sweating.  Your vision changes.  You are bleeding.  You have a headache.  You have neck pain or a stiff neck.  You have a fever. This information is not intended to replace advice given to you by your health care provider. Make sure you discuss any questions you have with your health care provider. Document Released: 02/18/2011 Document Revised: 08/07/2015 Document Reviewed: 02/25/2014 Elsevier Interactive Patient Education  2017 [employer]     Edwina Barth, MD [facility]

## 2016-04-29 NOTE — Patient Instructions (Addendum)
IF you received an x-ray today, you will receive an invoice from [facility]. Please contact [facility] at [phone] with questions or concerns regarding your invoice.   IF you received labwork today, you will receive an invoice from [facility]. Please contact [facility] at [phone] with questions or concerns regarding your invoice.   Our billing staff will not be able to assist you with questions regarding bills from these companies.  You will be contacted with the lab results as soon as they are available. The fastest way to get your results is to activate your My Chart account. Instructions are located on the last page of this paperwork. If you have not heard from us regarding the results in 2 weeks, please contact this office.      Dizziness Dizziness is a common problem. It makes you feel unsteady or lightheaded. You may feel like you are about to pass out (faint). Dizziness can lead to injury if you stumble or fall. Anyone can get dizzy, but dizziness is more common in older adults. This condition can be caused by a number of things, including:  Medicines.  Dehydration.  Illness. Follow these instructions at home: Following these instructions may help with your condition: Eating and drinking  Drink enough fluid to keep your pee (urine) clear or pale yellow. This helps to keep you from getting dehydrated. Try to drink more clear fluids, such as water.  Do not drink alcohol.  Limit how much caffeine you drink or eat if told by your doctor.  Limit how much salt you drink or eat if told by your doctor. Activity  Avoid making quick movements.  When you stand up from sitting in a chair, steady yourself until you feel okay.  In the morning, first sit up on the side of the bed. When you feel okay, stand slowly while you hold onto something. Do this until you know that your balance is fine.  Move your legs often if you need to stand in one place for a  long time. Tighten and relax your muscles in your legs while you are standing.  Do not drive or use heavy machinery if you feel dizzy.  Avoid bending down if you feel dizzy. Place items in your home so that they are easy for you to reach without leaning over. Lifestyle  Do not use any tobacco products, including cigarettes, chewing tobacco, or electronic cigarettes. If you need help quitting, ask your doctor.  Try to lower your stress level, such as with yoga or meditation. Talk with your doctor if you need help. General instructions  Watch your dizziness for any changes.  Take medicines only as told by your doctor. Talk with your doctor if you think that your dizziness is caused by a medicine that you are taking.  Tell a friend or a family member that you are feeling dizzy. If he or she notices any changes in your behavior, have this person call your doctor.  Keep all follow-up visits as told by your doctor. This is important. Contact a doctor if:  Your dizziness does not go away.  Your dizziness or light-headedness gets worse.  You feel sick to your stomach (nauseous).  You have trouble hearing.  You have new symptoms.  You are unsteady on your feet or you feel like the room is spinning. Get help right away if:  You throw up (vomit) or have diarrhea and are unable to eat or drink anything.  You have trouble:  Talking.  Walking.  Swallowing.  Using your arms, hands, or legs.  You feel generally weak.  You are not thinking clearly or you have trouble forming sentences. It may take a friend or family member to notice this.  You have:  Chest pain.  Pain in your belly (abdomen).  Shortness of breath.  Sweating.  Your vision changes.  You are bleeding.  You have a headache.  You have neck pain or a stiff neck.  You have a fever. This information is not intended to replace advice given to you by your health care provider. Make sure you discuss any  questions you have with your health care provider. Document Released: 02/18/2011 Document Revised: 08/07/2015 Document Reviewed: 02/25/2014 Elsevier Interactive Patient Education  2017 [employer].

## 2016-05-06 ENCOUNTER — Other Ambulatory Visit: Payer: BLUE CROSS/BLUE SHIELD

## 2016-05-13 ENCOUNTER — Ambulatory Visit (INDEPENDENT_AMBULATORY_CARE_PROVIDER_SITE_OTHER): Payer: BLUE CROSS/BLUE SHIELD | Admitting: Neurology

## 2016-05-13 DIAGNOSIS — R410 Disorientation, unspecified: Secondary | ICD-10-CM

## 2016-05-17 ENCOUNTER — Telehealth: Payer: Self-pay | Admitting: Neurology

## 2016-05-17 NOTE — Telephone Encounter (Signed)
Patient called office in reference to requesting EEG results.  Please call

## 2016-05-18 NOTE — Telephone Encounter (Signed)
Please call EEG was normal, I also released the result to my chart

## 2016-05-18 NOTE — Procedures (Signed)
   HISTORY: 26 years old male complains of difficulty talking, tunnel vision  TECHNIQUE:  16 channel EEG was performed based on standard 10-16 international system. One channel was dedicated to EKG, which has demonstrates normal sinus rhythm of 72 beats per minutes.  Upon awakening, the posterior background activity was well-developed, in alpha range,  reactive to eye opening and closure.  There was no evidence of epileptiform discharge.  Photic stimulation was performed, which induced a symmetric photic driving.  Hyperventilation was performed, there was no abnormality elicit.  Stage II sleep was achieved.  CONCLUSION: This is a  normal awake and asleep EEG.  There is no electrodiagnostic evidence of epileptiform discharge.  Levert FeinsteinYijun Demontray Franta, M.D. Ph.D.  [facility] [address] [place], [place] [address] Phone: [phone] Fax:      [phone]

## 2016-05-18 NOTE — Telephone Encounter (Signed)
Patient aware of results.  Reports an event on 05/17/16 while at work - BP 140s/90s, HR 114, Glucose 70 - felt weak, dizzy, confused.  He is now checking his blood sugar when he feels this way.  He will continue his medication and keep his follow up on 05/27/16.

## 2016-05-27 ENCOUNTER — Encounter: Payer: Self-pay | Admitting: Neurology

## 2016-05-27 ENCOUNTER — Ambulatory Visit (INDEPENDENT_AMBULATORY_CARE_PROVIDER_SITE_OTHER): Payer: BLUE CROSS/BLUE SHIELD | Admitting: Neurology

## 2016-05-27 VITALS — BP 136/80 | HR 76 | Ht 72.0 in | Wt 202.0 lb

## 2016-05-27 DIAGNOSIS — G43709 Chronic migraine without aura, not intractable, without status migrainosus: Secondary | ICD-10-CM | POA: Diagnosis not present

## 2016-05-27 DIAGNOSIS — R42 Dizziness and giddiness: Secondary | ICD-10-CM

## 2016-05-27 DIAGNOSIS — R410 Disorientation, unspecified: Secondary | ICD-10-CM | POA: Diagnosis not present

## 2016-05-27 DIAGNOSIS — IMO0002 Reserved for concepts with insufficient information to code with codable children: Secondary | ICD-10-CM

## 2016-05-27 MED ORDER — RIZATRIPTAN BENZOATE 10 MG PO TBDP: 10.0000 mg | ORAL_TABLET | ORAL | 6 refills | Status: AC | PRN

## 2016-05-27 NOTE — Progress Notes (Signed)
PATIENT: Sergi Gellner DOB: 1990/04/14  Chief Complaint  Patient presents with  . Transient Confusion    He would like to review his MRI and EEG results.  Reports 8-10 episodes since being on Keppra 500mg , BID.  He is no longer having them daily.   He has noticed an increase in headaches and neck stiffness.      HISTORICAL  Tyshaun Vinzant is a 26 years old right-handed male, accompanied by his wife Kennyth Arnold, seen in refer by his primary care  [facility], for evaluation of recurrent confusion spells, initial evaluation was February twelfth 2018.  He reported recurrent spells of sudden onset slurred speech since 2016, it used to happen intermittently, but since April 15 2016, he had increased spells, he was sitting watching TV, he had sudden onset confusion, dizziness, as if he is going to falling out of the bed, this last for few minutes, with mild slurred speech, confusion," as if I got a good buzz from alcohol", ever since the event, he be had multiple similar episode in a day, no loss of consciousness, but he described episode of confusion, forgot where he supposed to do. He denies chest pain, heart palpitation.  He worked at [employer], require some machine operation,  Such as using air gun  Reviewed laboratory evaluation, normal TSH, CBC, CMP with exception of slight elevated bilirubin 1.4,  UPDATE May 27 2016: He complains of multiple episode in the day, multiple days in a row of similar stereotypical spells before taking Keppra, set onset slurred speech, mild confusion, dizziness, "as if good buzz from alcohol lasting for few minutes", followed by mild confusion afterwards but never had loss of consciousness or seizure-like activity, and episode he also described bilateral arm and leg weakness, denies chest pain, denied heart palpitation, now he has much less spells, only occasionally.  We have personally reviewed MRI brain wo in Feb 2018 were normal.  EEG was  normal.  Also complains of intermittent bilateral retro-orbital area pressure headaches, 3-4 times each week, worsening by movement, noise sensitivity, no significant light sensitivity, no nausea, he been self-medicating with Goody powder, and lasting for few hours,  In Feb 2018, he went to zoo, there was bright light in a very dark background, he had set onset dizziness, confusion, no passing out, no headaches, lasting for a few minutes.  REVIEW OF SYSTEMS: Full 14 system review of systems performed and notable only for neck pain Stiffness    ALLERGIES: Allergies  Allergen Reactions  . Bee Venom Shortness Of Breath and Swelling    HOME MEDICATIONS: Current Outpatient Prescriptions  Medication Sig Dispense Refill  . levETIRAcetam (KEPPRA) 500 MG tablet Take 1 tablet (500 mg total) by mouth 2 (two) times daily. 60 tablet 11  . naproxen sodium (ANAPROX) 220 MG tablet Take 440 mg by mouth daily as needed (pain). ALEVE     No current facility-administered medications for this visit.     PAST MEDICAL HISTORY: Past Medical History:  Diagnosis Date  . Allergy   . Anxiety   . Asthma   . Depression     PAST SURGICAL HISTORY: Past Surgical History:  Procedure Laterality Date  . FOOT SURGERY Bilateral    and right foot x 10 years ago    FAMILY HISTORY: Family History  Problem Relation Age of Onset  . Hyperlipidemia Mother   . Hypertension Mother   . Heart disease Mother   . Hyperlipidemia Father   . Cancer  Maternal Grandmother     lung and liver  . Hyperlipidemia Maternal Grandmother   . Cancer Maternal Grandfather     colon and prostate  . Hyperlipidemia Maternal Grandfather   . Heart disease Maternal Grandfather   . Stroke Maternal Grandfather   . Hyperlipidemia Paternal Grandmother     SOCIAL HISTORY:  Social History   Social History  . Marital status: Married    Spouse name: N/A  . Number of children: 1  . Years of education: HS   Occupational History  .  assembly technician     builds school buses   Social History Main Topics  . Smoking status: Never Smoker  . Smokeless tobacco: Never Used  . Alcohol use No     Comment: rare a beer  . Drug use: No  . Sexual activity: Not on file   Other Topics Concern  . Not on file   Social History Narrative   Lives at home with his wife and daughter.   Right-handed.   3 cups caffeine daily.     PHYSICAL EXAM   Vitals:   05/27/16 1315  BP: 136/80  Pulse: 76  Weight: 202 lb (91.6 kg)  Height: 6' (1.829 m)    Not recorded      Body mass index is 27.4 kg/m.  PHYSICAL EXAMNIATION:  Gen: NAD, conversant, well nourised, obese, well groomed                     Cardiovascular: Regular rate rhythm, no peripheral edema, warm, nontender. Eyes: Conjunctivae clear without exudates or hemorrhage Neck: Supple, no carotid bruits. Pulmonary: Clear to auscultation bilaterally   NEUROLOGICAL EXAM:  MENTAL STATUS: Speech:    Speech is normal; fluent and spontaneous with normal comprehension.  Cognition:     Orientation to time, place and person     Normal recent and remote memory     Normal Attention span and concentration     Normal Language, naming, repeating,spontaneous speech     Fund of knowledge   CRANIAL NERVES: CN II: Visual fields are full to confrontation. Fundoscopic exam is normal with sharp discs and no vascular changes. Pupils are round equal and briskly reactive to light. CN III, IV, VI: extraocular movement are normal. No ptosis. CN V: Facial sensation is intact to pinprick in all 3 divisions bilaterally. Corneal responses are intact.  CN VII: Face is symmetric with normal eye closure and smile. CN VIII: Hearing is normal to rubbing fingers CN IX, X: Palate elevates symmetrically. Phonation is normal. CN XI: Head turning and shoulder shrug are intact CN XII: Tongue is midline with normal movements and no atrophy.  MOTOR: There is no pronator drift of out-stretched  arms. Muscle bulk and tone are normal. Muscle strength is normal.  REFLEXES: Reflexes are 2+ and symmetric at the biceps, triceps, knees, and ankles. Plantar responses are flexor.  SENSORY: Intact to light touch, pinprick, positional sensation and vibratory sensation are intact in fingers and toes.  COORDINATION: Rapid alternating movements and fine finger movements are intact. There is no dysmetria on finger-to-nose and heel-knee-shin.    GAIT/STANCE: Posture is normal. Gait is steady with normal steps, base, arm swing, and turning. Heel and toe walking are normal. Tandem gait is normal.  Romberg is absent.   DIAGNOSTIC DATA (LABS, IMAGING, TESTING) - I reviewed patient records, labs, notes, testing and imaging myself where available.   ASSESSMENT AND PLAN  Marlou SaCarl Daubert is a 26 y.o. male  Sudden onset transient confusion,  Stereotypical episodes multiple episode in a day  Probable partial seizure, also presyncope,  Responding to Keppra 500 mg twice a day  Document allevent,    refer him to cardiac monitoring   Migraine headache   Maxalt as needed  Levert Feinstein, M.D. Ph.D.  [facility] [address] [place], [place] [address] Ph: [phone] Fax: [phone]  CC: Georgina Quint, MD

## 2016-06-01 ENCOUNTER — Ambulatory Visit (INDEPENDENT_AMBULATORY_CARE_PROVIDER_SITE_OTHER): Payer: BLUE CROSS/BLUE SHIELD

## 2016-06-01 DIAGNOSIS — R410 Disorientation, unspecified: Secondary | ICD-10-CM | POA: Diagnosis not present

## 2016-06-01 DIAGNOSIS — G43709 Chronic migraine without aura, not intractable, without status migrainosus: Secondary | ICD-10-CM | POA: Diagnosis not present

## 2016-06-01 DIAGNOSIS — R42 Dizziness and giddiness: Secondary | ICD-10-CM

## 2016-06-01 DIAGNOSIS — IMO0002 Reserved for concepts with insufficient information to code with codable children: Secondary | ICD-10-CM

## 2016-06-30 ENCOUNTER — Encounter: Payer: Self-pay | Admitting: Neurology

## 2016-07-08 ENCOUNTER — Telehealth: Payer: Self-pay | Admitting: Emergency Medicine

## 2016-07-08 NOTE — Telephone Encounter (Signed)
Needs an OV for work clearance.

## 2016-07-08 NOTE — Telephone Encounter (Signed)
Pt wants note to return to work without restrictions.  His neurologist said we had to lift the restrictions since we originally put him on them.  I advised him I thought this would require an OV, but he wanted to try to get it done just with a phone call.  Wants note faxed to work.  Fax # at work is [phone].  Call him at [phone]

## 2016-07-08 NOTE — Telephone Encounter (Signed)
Last seen 04/2016 by [place]  [facility] 05/2016  Wants total release back to work w/o appt.

## 2016-07-28 ENCOUNTER — Ambulatory Visit: Payer: BLUE CROSS/BLUE SHIELD | Admitting: Emergency Medicine

## 2016-09-13 ENCOUNTER — Ambulatory Visit: Payer: BLUE CROSS/BLUE SHIELD | Admitting: Neurology

## 2017-04-25 DIAGNOSIS — J3489 Other specified disorders of nose and nasal sinuses: Secondary | ICD-10-CM | POA: Diagnosis not present

## 2017-04-25 DIAGNOSIS — J019 Acute sinusitis, unspecified: Secondary | ICD-10-CM | POA: Diagnosis not present

## 2017-04-25 DIAGNOSIS — J342 Deviated nasal septum: Secondary | ICD-10-CM | POA: Diagnosis not present

## 2017-04-25 DIAGNOSIS — H9209 Otalgia, unspecified ear: Secondary | ICD-10-CM | POA: Diagnosis not present

## 2017-10-04 NOTE — Telephone Encounter (Signed)
I call pt LVM pt needs to call his HR dept to have them to fax over FMLA form.

## 2017-10-04 NOTE — Telephone Encounter (Signed)
error 

## 2017-10-19 IMAGING — DX DG ABDOMEN 1V
2 series · 2 of 2 positions shown · non-contrast
Comparison: None.

CLINICAL DATA: Sharp right-sided abdomen pain for 3 days

EXAM:
ABDOMEN - 1 VIEW

[abdomen kub (1 of 2)]
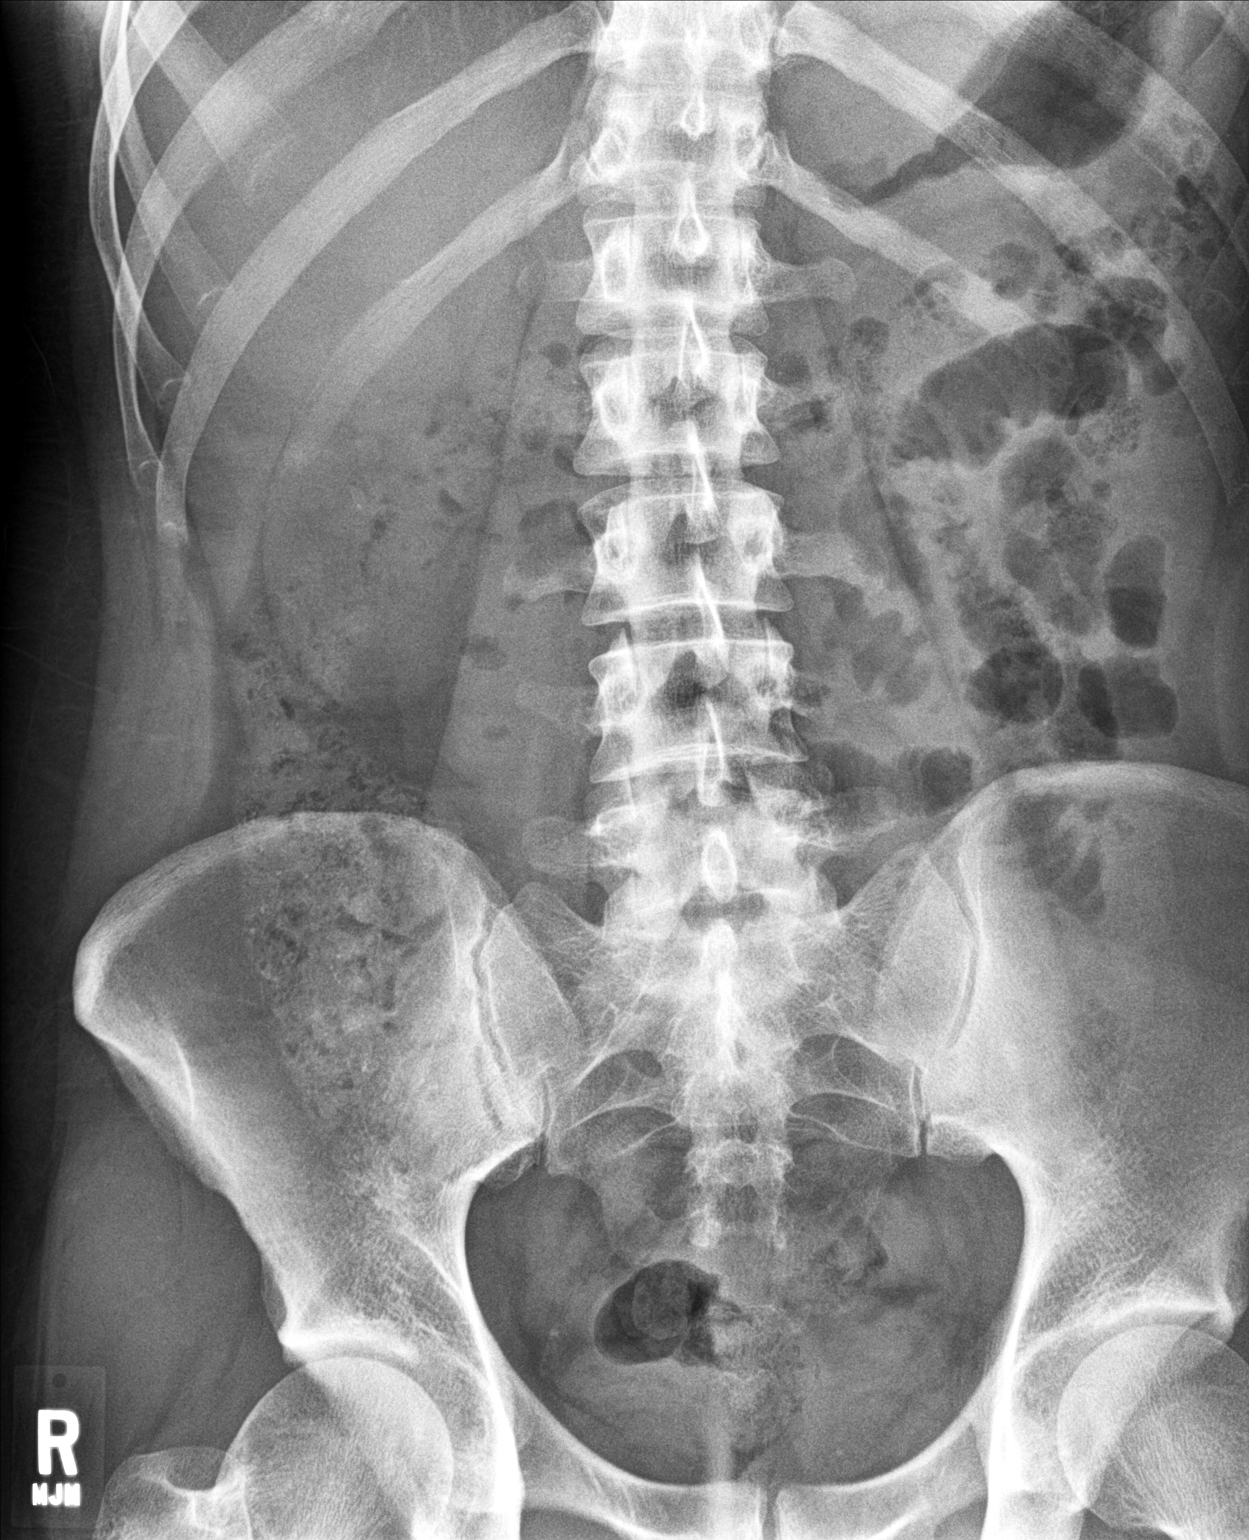

[abdomen kub (2 of 2)]
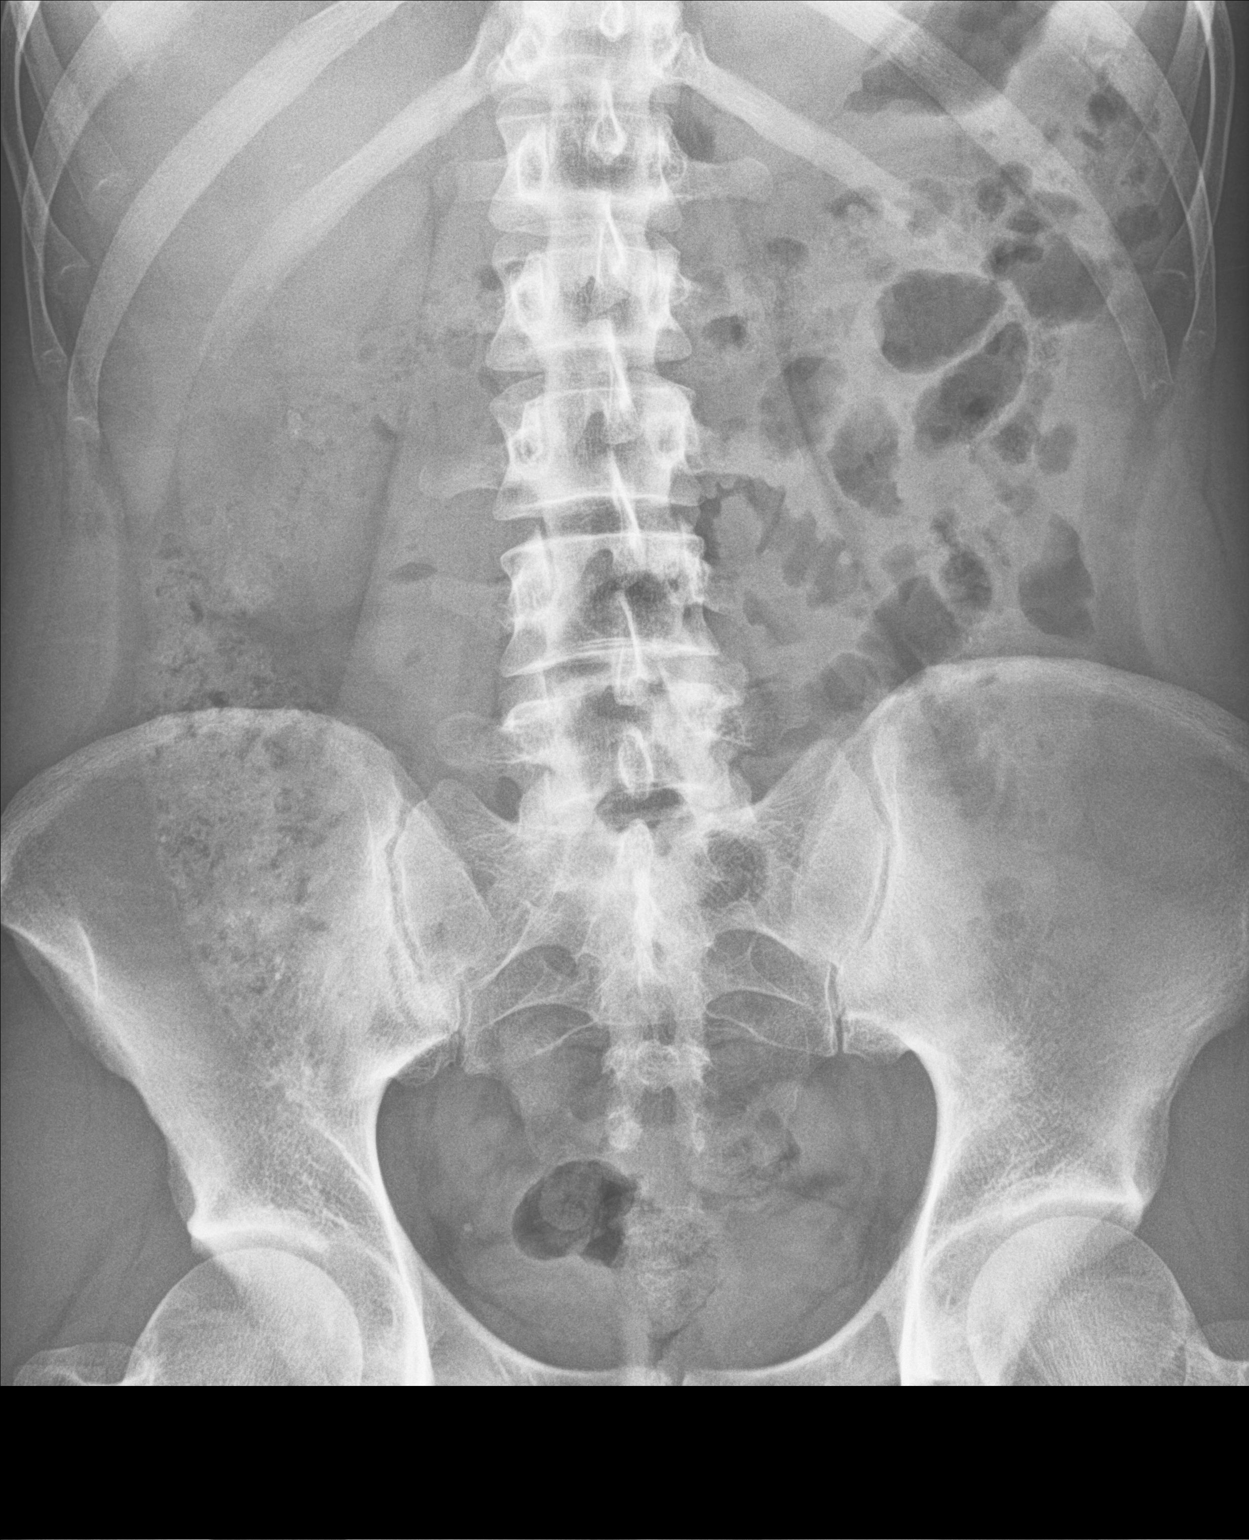

[2 of 2 positions shown; findings below may reference images not displayed]

FINDINGS: Supine views the abdomen show no bowel obstruction. Minimal
prominence of small bowel loops is noted in the left abdomen. A is
tiny right renal calculus cannot be excluded. Also there is a
calcification low in the right bony pelvis and a distal right
ureteral calculus cannot be excluded. No bony abnormality is seen.
Consider CT the abdomen pelvis if further assessment is warranted.
IMPRESSION: 1.  Slight prominence of small bowel loops on left abdomen.
2. Cannot exclude a small right renal calculus and possibly a distal
right ureteral calculus by plain film. Consider CT of the abdomen
pelvis to assess further if warranted.

## 2018-01-10 IMAGING — DX DG ORBITS FOR FOREIGN BODY
2 series · 2 of 2 positions shown · non-contrast
Comparison: None.

CLINICAL DATA: Metal working/exposure; clearance prior to MRI

EXAM:
ORBITS FOR FOREIGN BODY - 2 VIEW

[orbits waters (1 of 2)]
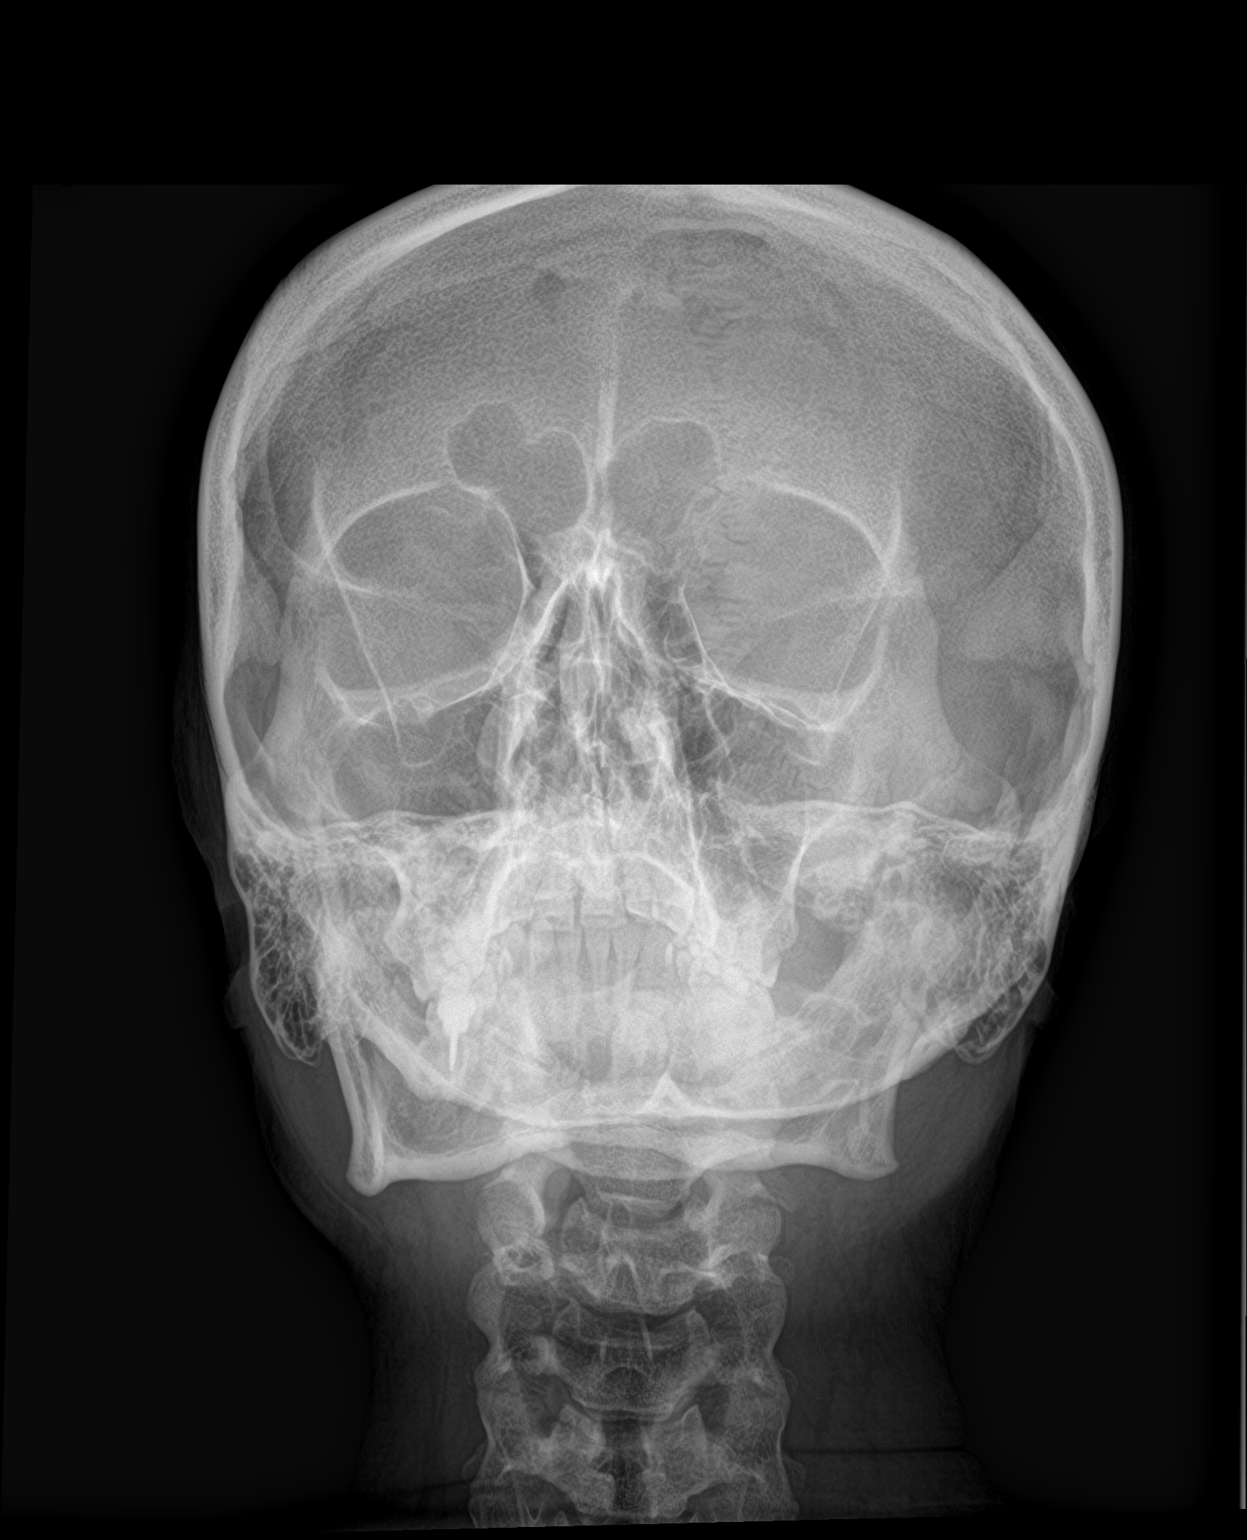

[orbits waters (2 of 2)]
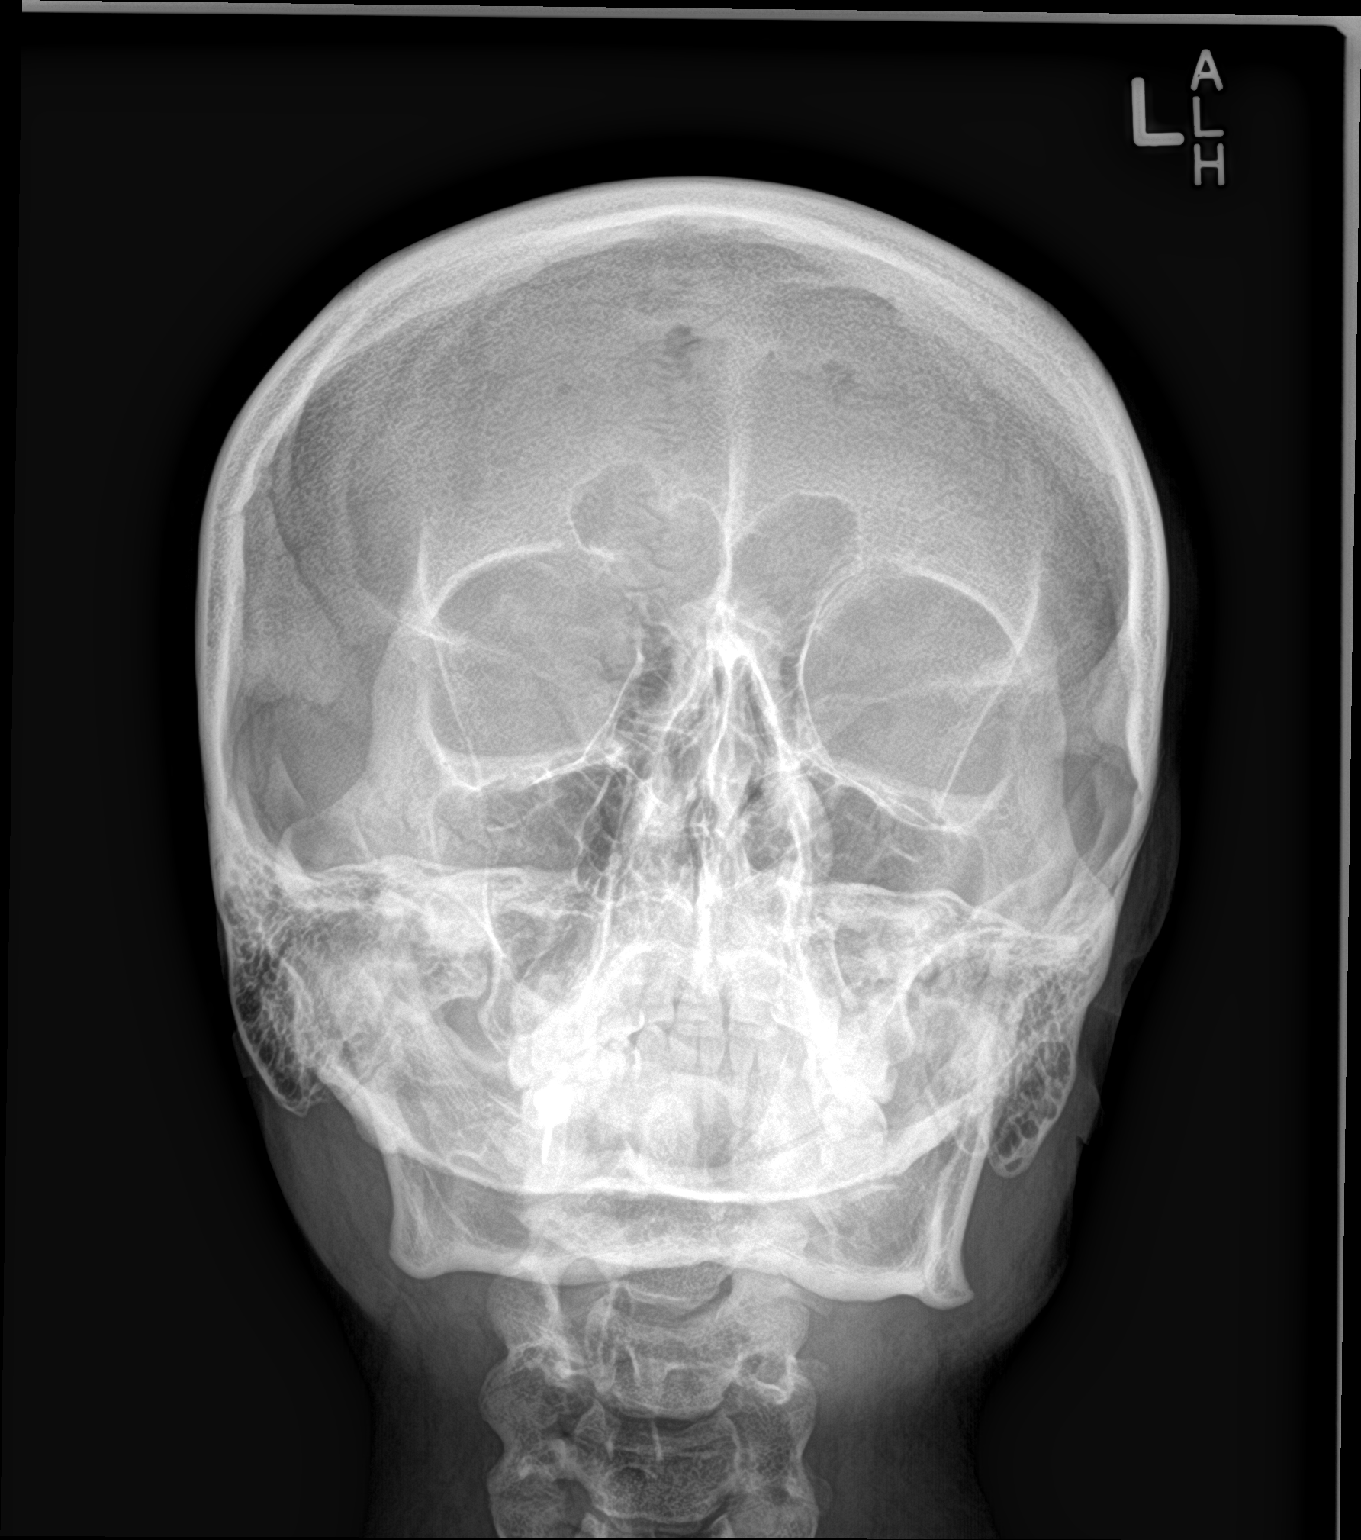

[2 of 2 positions shown; findings below may reference images not displayed]

FINDINGS: There is no evidence of metallic foreign body within the orbits. No
significant bone abnormality identified.
IMPRESSION: No evidence of metallic foreign body within the orbits.

## 2018-01-17 DIAGNOSIS — G43909 Migraine, unspecified, not intractable, without status migrainosus: Secondary | ICD-10-CM | POA: Diagnosis not present

## 2018-01-17 DIAGNOSIS — J4520 Mild intermittent asthma, uncomplicated: Secondary | ICD-10-CM | POA: Diagnosis not present

## 2018-02-08 DIAGNOSIS — Z0000 Encounter for general adult medical examination without abnormal findings: Secondary | ICD-10-CM | POA: Diagnosis not present

## 2018-02-14 DIAGNOSIS — G43909 Migraine, unspecified, not intractable, without status migrainosus: Secondary | ICD-10-CM | POA: Diagnosis not present

## 2018-02-14 DIAGNOSIS — Z23 Encounter for immunization: Secondary | ICD-10-CM | POA: Diagnosis not present

## 2018-02-14 DIAGNOSIS — Z0000 Encounter for general adult medical examination without abnormal findings: Secondary | ICD-10-CM | POA: Diagnosis not present

## 2018-03-09 DIAGNOSIS — Z23 Encounter for immunization: Secondary | ICD-10-CM | POA: Diagnosis not present

## 2018-07-31 DIAGNOSIS — F439 Reaction to severe stress, unspecified: Secondary | ICD-10-CM | POA: Diagnosis not present

## 2018-07-31 DIAGNOSIS — G43909 Migraine, unspecified, not intractable, without status migrainosus: Secondary | ICD-10-CM | POA: Diagnosis not present

## 2018-07-31 DIAGNOSIS — F418 Other specified anxiety disorders: Secondary | ICD-10-CM | POA: Diagnosis not present

## 2018-08-28 DIAGNOSIS — F418 Other specified anxiety disorders: Secondary | ICD-10-CM | POA: Diagnosis not present

## 2018-08-28 DIAGNOSIS — G43909 Migraine, unspecified, not intractable, without status migrainosus: Secondary | ICD-10-CM | POA: Diagnosis not present

## 2020-07-27 ENCOUNTER — Emergency Department (HOSPITAL_BASED_OUTPATIENT_CLINIC_OR_DEPARTMENT_OTHER): Payer: Managed Care, Other (non HMO)

## 2020-07-27 ENCOUNTER — Emergency Department (HOSPITAL_BASED_OUTPATIENT_CLINIC_OR_DEPARTMENT_OTHER)
Admission: EM | Admit: 2020-07-27 | Discharge: 2020-07-28 | Disposition: A | Discharge status: Home / Self Care | Payer: Managed Care, Other (non HMO) | Attending: Emergency Medicine | Admitting: Emergency Medicine

## 2020-07-27 ENCOUNTER — Encounter (HOSPITAL_BASED_OUTPATIENT_CLINIC_OR_DEPARTMENT_OTHER): Payer: Self-pay

## 2020-07-27 ENCOUNTER — Other Ambulatory Visit: Payer: Self-pay

## 2020-07-27 DIAGNOSIS — Z113 Encounter for screening for infections with a predominantly sexual mode of transmission: Secondary | ICD-10-CM | POA: Diagnosis not present

## 2020-07-27 DIAGNOSIS — N12 Tubulo-interstitial nephritis, not specified as acute or chronic: Secondary | ICD-10-CM | POA: Insufficient documentation

## 2020-07-27 DIAGNOSIS — R300 Dysuria: Secondary | ICD-10-CM | POA: Diagnosis present

## 2020-07-27 DIAGNOSIS — Z7289 Other problems related to lifestyle: Secondary | ICD-10-CM | POA: Diagnosis not present

## 2020-07-27 DIAGNOSIS — J45909 Unspecified asthma, uncomplicated: Secondary | ICD-10-CM | POA: Insufficient documentation

## 2020-07-27 DIAGNOSIS — K219 Gastro-esophageal reflux disease without esophagitis: Secondary | ICD-10-CM | POA: Diagnosis not present

## 2020-07-27 DIAGNOSIS — R109 Unspecified abdominal pain: Secondary | ICD-10-CM | POA: Diagnosis present

## 2020-07-27 LAB — CBC
HCT: 46.6 % (ref 39.0–52.0)
Hemoglobin: 15.6 g/dL (ref 13.0–17.0)
MCH: 30.5 pg (ref 26.0–34.0)
MCHC: 33.5 g/dL (ref 30.0–36.0)
MCV: 91.2 fL (ref 80.0–100.0)
Platelets: 234 10*3/uL (ref 150–400)
RBC: 5.11 MIL/uL (ref 4.22–5.81)
RDW: 12.5 % (ref 11.5–15.5)
WBC: 9.5 10*3/uL (ref 4.0–10.5)
nRBC: 0 % (ref 0.0–0.2)

## 2020-07-27 LAB — URINALYSIS, ROUTINE W REFLEX MICROSCOPIC
Bilirubin Urine: NEGATIVE
Glucose, UA: NEGATIVE mg/dL
Ketones, ur: NEGATIVE mg/dL
Leukocytes,Ua: NEGATIVE
Nitrite: NEGATIVE
Protein, ur: NEGATIVE mg/dL
Specific Gravity, Urine: 1.02 (ref 1.005–1.030)
pH: 7 (ref 5.0–8.0)

## 2020-07-27 LAB — BASIC METABOLIC PANEL
Anion gap: 8 (ref 5–15)
BUN: 20 mg/dL (ref 6–20)
CO2: 24 mmol/L (ref 22–32)
Calcium: 9.1 mg/dL (ref 8.9–10.3)
Chloride: 106 mmol/L (ref 98–111)
Creatinine, Ser: 1.02 mg/dL (ref 0.61–1.24)
GFR, Estimated: >60 mL/min (ref >60)
Glucose, Bld: 104 mg/dL — ABNORMAL HIGH (ref 70–99)
Potassium: 3.6 mmol/L (ref 3.5–5.1)
Sodium: 138 mmol/L (ref 135–145)

## 2020-07-27 LAB — URINALYSIS, MICROSCOPIC (REFLEX)

## 2020-07-27 MED ORDER — ONDANSETRON 4 MG PO TBDP: 4.0000 mg | ORAL_TABLET | Freq: Three times a day (TID) | ORAL | 0 refills | Status: AC | PRN

## 2020-07-27 MED ORDER — SULFAMETHOXAZOLE-TRIMETHOPRIM 800-160 MG PO TABS
1.0000 | ORAL_TABLET | Freq: Once | ORAL | Status: AC
  Administered 2020-07-28: 1 via ORAL
  Filled 2020-07-27: qty 1

## 2020-07-27 MED ORDER — SULFAMETHOXAZOLE-TRIMETHOPRIM 800-160 MG PO TABS: 1.0000 | ORAL_TABLET | Freq: Two times a day (BID) | ORAL | 0 refills | Status: AC

## 2020-07-27 NOTE — ED Triage Notes (Signed)
R flank pain with dysuria x 6 days.   Hx of kidney stones

## 2020-07-27 NOTE — ED Provider Notes (Signed)
[facility] EMERGENCY DEPARTMENT Provider Note   CSN: [id number] Arrival date & time: 07/27/20  1840     History Chief Complaint  Patient presents with  . Flank Pain    George Duran. is a 30 y.o. male past medical history of nephrolithiasis, anxiety, depression, asthma, presenting for evaluation of dysuria and right flank pain.  Patient states he began having irritation with urination about a week and a half ago.  Yesterday began having right-sided flank pain that is moderate in quality.  Denies any associated nausea, vomiting, fever.  He went to urgent care which noted some hematuria and possible infection therefore they sent him here for imaging.  He denies any abdominal pain.  He denies any rashes or lesions.  States intermittently may have some clear penile discharge though he is unsure.  He is not having any testicular pain.  He is sexually active with 1 male partner, does not believe he has encountered any STDs though cannot be sure.  Reports history of nephrolithiasis about 3 years ago.  The history is provided by the patient.       Past Medical History:  Diagnosis Date  . Allergy   . Anxiety   . Asthma   . Depression     Patient Active Problem List   Diagnosis Date Noted  . Chronic migraine 05/27/2016  . Dizziness and giddiness 04/29/2016  . Confusion 04/26/2016  . GERD (gastroesophageal reflux disease) 05/04/2013  . Crush injury of toe 10/05/2011    Past Surgical History:  Procedure Laterality Date  . FOOT SURGERY Bilateral    and right foot x 10 years ago       Family History  Problem Relation Age of Onset  . Hyperlipidemia Mother   . Hypertension Mother   . Heart disease Mother   . Hyperlipidemia Father   . Cancer Maternal Grandmother        lung and liver  . Hyperlipidemia Maternal Grandmother   . Cancer Maternal Grandfather        colon and prostate  . Hyperlipidemia Maternal Grandfather   . Heart disease Maternal Grandfather   .  Stroke Maternal Grandfather   . Hyperlipidemia Paternal Grandmother     Social History   Tobacco Use  . Smoking status: Never Smoker  . Smokeless tobacco: Never Used  Vaping Use  . Vaping Use: Never used  Substance Use Topics  . Alcohol use: No    Alcohol/week: 0.0 standard drinks    Comment: rare a beer  . Drug use: No    Home Medications Prior to Admission medications   Medication Sig Start Date End Date Taking? Authorizing Provider  cetirizine (ZYRTEC) 10 MG tablet Take 10 mg by mouth daily.   Yes [provider]  omeprazole (PRILOSEC) 10 MG capsule Take 10 mg by mouth daily.   Yes [provider]  ondansetron (ZOFRAN ODT) 4 MG disintegrating tablet Take 1 tablet (4 mg total) by mouth every 8 (eight) hours as needed for nausea or vomiting. 07/27/20  Yes Marcella Charlson, [place] N, PA-C  sulfamethoxazole-trimethoprim (BACTRIM DS) 800-160 MG tablet Take 1 tablet by mouth 2 (two) times daily for 10 days. 07/27/20 08/06/20 Yes Roxan Hockey, [place] N, PA-C  levETIRAcetam (KEPPRA) 500 MG tablet Take 1 tablet (500 mg total) by mouth 2 (two) times daily. 04/26/16   Levert Feinstein, MD  naproxen sodium (ANAPROX) 220 MG tablet Take 440 mg by mouth daily as needed (pain). ALEVE    [provider]  rizatriptan (MAXALT-MLT) 10 MG disintegrating tablet Take 1 tablet (10 mg total) by mouth as needed. May repeat in 2 hours if needed 05/27/16   Levert Feinstein, MD    Allergies    Bee venom  Review of Systems   Review of Systems  Genitourinary: Positive for dysuria, flank pain and hematuria.  All other systems reviewed and are negative.   Physical Exam Updated Vital Signs BP 120/77   Pulse 86   Temp 98.3 F (36.8 C)   Resp 19   Ht 6' (1.829 m)   Wt 95.3 kg   SpO2 99%   BMI 28.48 kg/m   Physical Exam Vitals and nursing note reviewed.  Constitutional:      General: He is not in acute distress.    Appearance: He is well-developed. He is not ill-appearing.  HENT:     Head:  Normocephalic and atraumatic.  Eyes:     Conjunctiva/sclera: Conjunctivae normal.  Cardiovascular:     Rate and Rhythm: Normal rate and regular rhythm.  Pulmonary:     Effort: Pulmonary effort is normal. No respiratory distress.     Breath sounds: Normal breath sounds.  Abdominal:     General: Bowel sounds are normal.     Palpations: Abdomen is soft.     Tenderness: There is no abdominal tenderness. There is right CVA tenderness. There is no left CVA tenderness.  Skin:    General: Skin is warm.  Neurological:     Mental Status: He is alert.  Psychiatric:        Behavior: Behavior normal.     ED Results / Procedures / Treatments   Labs (all labs ordered are listed, but only abnormal results are displayed) Labs Reviewed  URINALYSIS, ROUTINE W REFLEX MICROSCOPIC - Abnormal; Notable for the following components:      Result Value   Hgb urine dipstick TRACE (*)    All other components within normal limits  URINALYSIS, MICROSCOPIC (REFLEX) - Abnormal; Notable for the following components:   Bacteria, UA MANY (*)    All other components within normal limits  BASIC METABOLIC PANEL - Abnormal; Notable for the following components:   Glucose, Bld 104 (*)    All other components within normal limits  URINE CULTURE  CBC  GC/CHLAMYDIA PROBE AMP ([place]) NOT AT [facility]    EKG None  Radiology CT Renal Stone Study  Result Date: 07/27/2020 CLINICAL DATA:  Right flank pain with dysuria EXAM: CT ABDOMEN AND PELVIS WITHOUT CONTRAST TECHNIQUE: Multidetector CT imaging of the abdomen and pelvis was performed following the standard protocol without oral or IV contrast. COMPARISON:  None. FINDINGS: Lower chest: Lung bases are clear. Hepatobiliary: No focal liver lesions are appreciable. Gallbladder is contracted without appreciable gallbladder wall thickening. There is no biliary duct dilatation. Pancreas: There is no appreciable pancreatic mass or inflammatory focus. Spleen: No splenic  lesions are evident. Adrenals/Urinary Tract: Adrenals bilaterally appear normal. No evident renal mass on either side. There is slight hydronephrosis on the right. No hydronephrosis on the left. There is a tiny calculus in the mid right kidney. No calculus in left kidney. No ureteral calculus evident on either side. Urinary bladder is midline with wall thickness within normal limits. Stomach/Bowel: There is no appreciable bowel wall or mesenteric thickening. There is no demonstrable bowel obstruction. The terminal ileum appears normal. Appendix appears normal. No free air or portal venous air. Vascular/Lymphatic: No abdominal aortic aneurysm. No vascular lesions evident on this noncontrast enhanced study. No  evident adenopathy in the abdomen or pelvis. Reproductive: Prostate and seminal vesicles are normal in size and contour. Minimal calcification noted in the prostate. Other: No evident abscess or ascites in the abdomen or pelvis. Musculoskeletal: No blastic or lytic bone lesions. No abdominal wall or intramuscular lesions. IMPRESSION: 1. Slight hydronephrosis on the right. No ureteral calculus evident on the right. Question recent calculus passage on the right. Note that early right-sided pyelonephritis could present in this manner and is a differential consideration. No soft tissue stranding or abscess noted in the right kidney. 2.  Tiny nonobstructing calculus mid right kidney. 3. No bowel wall thickening or bowel obstruction. No abscess in the abdomen or pelvis. Appendix appears normal. Electronically Signed   By: Bretta Bang III M.D.   On: 07/27/2020 23:32    Procedures Procedures   Medications Ordered in ED Medications  sulfamethoxazole-trimethoprim (BACTRIM DS) 800-160 MG per tablet 1 tablet (has no administration in time range)    ED Course  I have reviewed the triage vital signs and the nursing notes.  Pertinent labs & imaging results that were available during my care of the patient  were reviewed by me and considered in my medical decision making (see chart for details).    MDM Rules/Calculators/A&P                          Patient is presenting for evaluation of dysuria and right-sided flank pain.  History of kidney stones.  Went to urgent care who noted blood and bacteria in his urine therefore they sent him here for further evaluation.  Pain is minimal on evaluation, no vomiting.  UA with trace hemoglobin, negative leuks, 11-20 white cells and many bacteria.  GC chlamydia and urine culture are also sent.  Unsure decision making with patient regarding imaging versus antibiotic treatment and symptom management.  CT stone study is done and shows findings consistent with either recently passed stone versus possibly developing right-sided pyelonephritis.  He will be discharged with Bactrim.  He is aware he has STD cultures pending and to avoid any intercourse while awaiting his results.  Close outpatient follow-up and strict return precautions.  Patient discharged in no distress.  Discussed results, findings, treatment and follow up. Patient advised of return precautions. Patient verbalized understanding and agreed with plan.  Final Clinical Impression(s) / ED Diagnoses Final diagnoses:  Pyelonephritis    Rx / DC Orders ED Discharge Orders         Ordered    sulfamethoxazole-trimethoprim (BACTRIM DS) 800-160 MG tablet  2 times daily        07/27/20 2358    ondansetron (ZOFRAN ODT) 4 MG disintegrating tablet  Every 8 hours PRN        07/27/20 2358           Madora Barletta, [place] N, PA-C 07/28/20 0008    Rolan Bucco, MD 08/05/20 [phone]

## 2020-07-28 LAB — GC/CHLAMYDIA PROBE AMP (~~LOC~~) NOT AT ARMC
Chlamydia: NEGATIVE
Comment: NEGATIVE
Comment: NORMAL
Neisseria Gonorrhea: NEGATIVE

## 2020-07-28 NOTE — Discharge Instructions (Addendum)
As discussed your CT scan shows findings that may indicate recently passed kidney stone versus developing kidney infection.  With the bacteria in your urine and the pain in your side, we are treating you for UTI and the antibiotic course will cover a kidney infection You have gonorrhea and chlamydia cultures pending.  Avoid any intercourse until you know your results and they are negative.  If they are positive you should receive a call from the hospital so that you can receive treatment. Treat your pain with ibuprofen and Tylenol.  If you develop high fever, uncontrollable pain, uncontrollable vomiting, or other concerning symptoms, please return to the emergency department for urgent evaluation.  Otherwise follow with your primary care.

## 2020-07-28 NOTE — ED Notes (Signed)
Lab notified of add on urine culture

## 2020-07-29 LAB — URINE CULTURE: Culture: NO GROWTH

## 2021-01-24 ENCOUNTER — Encounter (HOSPITAL_COMMUNITY): Payer: Self-pay | Admitting: *Deleted

## 2021-01-24 ENCOUNTER — Emergency Department (HOSPITAL_COMMUNITY)
Admission: EM | Admit: 2021-01-24 | Discharge: 2021-01-24 | Disposition: A | Discharge status: Home / Self Care | Payer: Worker's Compensation | Attending: Emergency Medicine | Admitting: Emergency Medicine

## 2021-01-24 ENCOUNTER — Ambulatory Visit (HOSPITAL_COMMUNITY)
Admission: EM | Admit: 2021-01-24 | Discharge: 2021-01-24 | Disposition: A | Discharge status: Home / Self Care | Payer: Managed Care, Other (non HMO)

## 2021-01-24 ENCOUNTER — Encounter (HOSPITAL_COMMUNITY): Payer: Self-pay | Admitting: Physician Assistant

## 2021-01-24 ENCOUNTER — Other Ambulatory Visit: Payer: Self-pay

## 2021-01-24 DIAGNOSIS — T2010XA Burn of first degree of head, face, and neck, unspecified site, initial encounter: Secondary | ICD-10-CM | POA: Insufficient documentation

## 2021-01-24 DIAGNOSIS — J45909 Unspecified asthma, uncomplicated: Secondary | ICD-10-CM | POA: Diagnosis not present

## 2021-01-24 DIAGNOSIS — Z77098 Contact with and (suspected) exposure to other hazardous, chiefly nonmedicinal, chemicals: Secondary | ICD-10-CM | POA: Insufficient documentation

## 2021-01-24 DIAGNOSIS — X088XXA Exposure to other specified smoke, fire and flames, initial encounter: Secondary | ICD-10-CM | POA: Insufficient documentation

## 2021-01-24 DIAGNOSIS — T2000XA Burn of unspecified degree of head, face, and neck, unspecified site, initial encounter: Secondary | ICD-10-CM | POA: Diagnosis present

## 2021-01-24 MED ORDER — TETRACAINE HCL 0.5 % OP SOLN
2.0000 [drp] | Freq: Once | OPHTHALMIC | Status: AC
  Administered 2021-01-24: 2 [drp] via OPHTHALMIC
  Filled 2021-01-24: qty 4

## 2021-01-24 MED ORDER — BACITRACIN ZINC 500 UNIT/GM EX OINT
TOPICAL_OINTMENT | Freq: Once | CUTANEOUS | Status: AC
  Administered 2021-01-24: 1 via TOPICAL
  Filled 2021-01-24: qty 1.8

## 2021-01-24 MED ORDER — PREDNISOLONE ACETATE 1 % OP SUSP
1.0000 [drp] | Freq: Once | OPHTHALMIC | Status: AC
  Administered 2021-01-24: 1 [drp] via OPHTHALMIC
  Filled 2021-01-24: qty 5

## 2021-01-24 MED ORDER — FLUORESCEIN SODIUM 1 MG OP STRP
1.0000 | ORAL_STRIP | Freq: Once | OPHTHALMIC | Status: AC
  Administered 2021-01-24: 1 via OPHTHALMIC
  Filled 2021-01-24: qty 1

## 2021-01-24 MED ORDER — NEOMYCIN-POLYMYXIN-DEXAMETH 3.5-10000-0.1 OP OINT
TOPICAL_OINTMENT | Freq: Once | OPHTHALMIC | Status: AC
  Administered 2021-01-24: 1 via OPHTHALMIC
  Filled 2021-01-24: qty 3.5

## 2021-01-24 MED ORDER — BACITRACIN ZINC 500 UNIT/GM EX OINT: 1.0000 "application " | TOPICAL_OINTMENT | Freq: Four times a day (QID) | CUTANEOUS | 0 refills | Status: AC

## 2021-01-24 NOTE — ED Notes (Signed)
Pt is 20/20 rt eye 20/20 lt eye
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
## 2021-01-24 NOTE — Consult Note (Signed)
Reason for Consult:Alkali injury of left eye Referring Physician: Jonell Cluck. is an 30 y.o. male.  HPI:   Patient reports at around 240 2:55 PM today he accidentally got sodium hydroxide in his left thigh and face.  He immediately irrigated the eye out for several minutes.  He presented to urgent care, who referred him to [facility] emergency department.  Prior to my arrival, the patient's left eye was irrigated with greater than 500 mL of normal saline.  Additional irrigation was performed as his pH was greater than 7 at that point time.  Past Medical History:  Diagnosis Date   Allergy    Anxiety    Asthma    Depression     Past Surgical History:  Procedure Laterality Date   FOOT SURGERY Bilateral    and right foot x 10 years ago    Family History  Problem Relation Age of Onset   Hyperlipidemia Mother    Hypertension Mother    Heart disease Mother    Hyperlipidemia Father    Cancer Maternal Grandmother        lung and liver   Hyperlipidemia Maternal Grandmother    Cancer Maternal Grandfather        colon and prostate   Hyperlipidemia Maternal Grandfather    Heart disease Maternal Grandfather    Stroke Maternal Grandfather    Hyperlipidemia Paternal Grandmother     Social History:  reports that he has never smoked. He has never used smokeless tobacco. He reports that he does not drink alcohol and does not use drugs.  Allergies:  Allergies  Allergen Reactions   Bee Venom Shortness Of Breath and Swelling    Medications: I have reviewed the patient's current medications.  No results found for this or any previous visit (from the past 48 hour(s)).  No results found.  Review of Systems Blood pressure (!) 114/97, pulse 63, temperature 98.3 F (36.8 C), temperature source Oral, resp. rate 14, height 6' (1.829 m), weight 95.3 kg, SpO2 98 %. Physical Exam  VA: 20/20 OU P: 4-2 round, brisk, no APD IOP: 20, 17 by [facility] M: FROM OU VF: FTCF  OU  External: Scattered erythema and ulceration of the face, predominantly in the left periorbita.  Lids/lashes: Right within normal limits.  Left eye with erythema and mild edema.  There is a conjunctival epi defect in the inferior fornix of the left eye involving the palpebral and bulbar conjunctiva.  Conjunctiva/sclera: Right within normal limits.  Left with 2-3+ injection.  Cornea: Clear OU without fluorescein uptake Anterior chamber: Deep OU Iris: Round and reactive OU Lens: Clear OU  Dilated fundus examination: Clear view OU Disc: 0.3, sharp margins OU Macula: wnl Vessels: wnl Periphery: wnl  Assessment/Plan: 30 year old male with alkali injury of the left conjunctiva and eyelids.  There is no apparent injury to the internal layers of the eye or the cornea, although it can take several days for damage to manifest.  Recommend: -Prednisolone acetate 1% 4 times daily to left eye -Maxitrol ophthalmic ointment 4 times daily to the left eyelids and lashes after prednisolone drops -Topical antibiotic to the skin 4 times daily.  -Vaseline to the facial wounds as needed to maintain moisture  Patient needs to follow-up on Tuesday with me or my partner Dr. Arnette Felts at [facility].   Dairl Ponder [facility] [phone] 01/24/2021, 8:18 PM

## 2021-01-24 NOTE — Discharge Instructions (Addendum)
-  Use prednisolone acetate 1% eyedrops, 1 drop 4 times daily to left eye -Use Maxitrol ophthalmic ointment 4 times daily to the left eyelids and lashes after prednisolone drops -Use bacitracin for skin 4 times daily.   -Use Vaseline to the facial wounds as needed to maintain moisture  Follow-up on Tuesday with Dr. Darcel Bayley or his partner Dr. Georga Hacking at [facility].  Number is below.
